# Patient Record
Sex: Female | Born: 1980 | Race: Black or African American | Hispanic: No | Marital: Married | Smoking: Never smoker
Health system: Southern US, Community
[De-identification: ages and names within clinical notes are randomized; demographics above are authoritative.]

## PROBLEM LIST (undated history)

## (undated) DIAGNOSIS — O039 Complete or unspecified spontaneous abortion without complication: Secondary | ICD-10-CM

## (undated) DIAGNOSIS — A549 Gonococcal infection, unspecified: Secondary | ICD-10-CM

## (undated) DIAGNOSIS — J189 Pneumonia, unspecified organism: Secondary | ICD-10-CM

## (undated) DIAGNOSIS — G43909 Migraine, unspecified, not intractable, without status migrainosus: Secondary | ICD-10-CM

## (undated) DIAGNOSIS — A749 Chlamydial infection, unspecified: Secondary | ICD-10-CM

## (undated) HISTORY — DX: Complete or unspecified spontaneous abortion without complication: O03.9

## (undated) HISTORY — DX: Pneumonia, unspecified organism: J18.9

## (undated) HISTORY — DX: Chlamydial infection, unspecified: A74.9

## (undated) HISTORY — DX: Migraine, unspecified, not intractable, without status migrainosus: G43.909

## (undated) HISTORY — DX: Gonococcal infection, unspecified: A54.9

---

## 1999-05-31 ENCOUNTER — Other Ambulatory Visit: Admission: RE | Admit: 1999-05-31 | Discharge: 1999-05-31 | Payer: Self-pay | Admitting: Obstetrics and Gynecology

## 2000-06-01 ENCOUNTER — Other Ambulatory Visit: Admission: RE | Admit: 2000-06-01 | Discharge: 2000-06-01 | Payer: Self-pay | Admitting: Obstetrics and Gynecology

## 2001-05-12 DIAGNOSIS — O039 Complete or unspecified spontaneous abortion without complication: Secondary | ICD-10-CM

## 2001-05-12 HISTORY — DX: Complete or unspecified spontaneous abortion without complication: O03.9

## 2001-06-02 ENCOUNTER — Other Ambulatory Visit: Admission: RE | Admit: 2001-06-02 | Discharge: 2001-06-02 | Payer: Self-pay | Admitting: Obstetrics and Gynecology

## 2002-07-07 ENCOUNTER — Other Ambulatory Visit: Admission: RE | Admit: 2002-07-07 | Discharge: 2002-07-07 | Payer: Self-pay | Admitting: Obstetrics and Gynecology

## 2003-07-13 ENCOUNTER — Other Ambulatory Visit: Admission: RE | Admit: 2003-07-13 | Discharge: 2003-07-13 | Payer: Self-pay | Admitting: Obstetrics and Gynecology

## 2005-11-29 ENCOUNTER — Emergency Department (HOSPITAL_COMMUNITY): Admission: EM | Admit: 2005-11-29 | Discharge: 2005-11-29 | Payer: Self-pay | Admitting: Emergency Medicine

## 2009-01-26 ENCOUNTER — Ambulatory Visit: Payer: Self-pay | Admitting: Women's Health

## 2009-01-26 ENCOUNTER — Encounter: Payer: Self-pay | Admitting: Women's Health

## 2009-01-26 ENCOUNTER — Other Ambulatory Visit: Admission: RE | Admit: 2009-01-26 | Discharge: 2009-01-26 | Payer: Self-pay | Admitting: Gynecology

## 2009-06-29 ENCOUNTER — Ambulatory Visit: Payer: Self-pay | Admitting: Family Medicine

## 2009-06-29 LAB — CONVERTED CEMR LAB
Inflenza A Ag: POSITIVE
Influenza B Ag: POSITIVE
Rapid Strep: NEGATIVE

## 2009-06-30 ENCOUNTER — Encounter: Payer: Self-pay | Admitting: Family Medicine

## 2009-12-26 ENCOUNTER — Ambulatory Visit: Payer: Self-pay | Admitting: Family Medicine

## 2009-12-26 DIAGNOSIS — G43909 Migraine, unspecified, not intractable, without status migrainosus: Secondary | ICD-10-CM | POA: Insufficient documentation

## 2009-12-26 DIAGNOSIS — D509 Iron deficiency anemia, unspecified: Secondary | ICD-10-CM

## 2009-12-27 ENCOUNTER — Telehealth (INDEPENDENT_AMBULATORY_CARE_PROVIDER_SITE_OTHER): Payer: Self-pay | Admitting: *Deleted

## 2009-12-27 ENCOUNTER — Telehealth: Payer: Self-pay | Admitting: Family Medicine

## 2009-12-27 LAB — CONVERTED CEMR LAB
ALT: 10 units/L (ref 0–35)
AST: 17 units/L (ref 0–37)
Albumin: 4.1 g/dL (ref 3.5–5.2)
Alkaline Phosphatase: 48 units/L (ref 39–117)
BUN: 5 mg/dL — ABNORMAL LOW (ref 6–23)
CO2: 26 meq/L (ref 19–32)
Calcium: 9.4 mg/dL (ref 8.4–10.5)
Chloride: 103 meq/L (ref 96–112)
Cholesterol: 196 mg/dL (ref 0–200)
Creatinine, Ser: 0.71 mg/dL (ref 0.40–1.20)
Glucose, Bld: 87 mg/dL (ref 70–99)
HCT: 40.1 % (ref 36.0–46.0)
HDL: 59 mg/dL (ref >39)
Hemoglobin: 12.8 g/dL (ref 12.0–15.0)
LDL Cholesterol: 125 mg/dL — ABNORMAL HIGH (ref 0–99)
MCHC: 31.9 g/dL (ref 30.0–36.0)
MCV: 100 fL (ref 78.0–100.0)
Platelets: 322 10*3/uL (ref 150–400)
Potassium: 4 meq/L (ref 3.5–5.3)
RBC: 4.01 M/uL (ref 3.87–5.11)
RDW: 13.4 % (ref 11.5–15.5)
Sodium: 139 meq/L (ref 135–145)
Total Bilirubin: 0.3 mg/dL (ref 0.3–1.2)
Total CHOL/HDL Ratio: 3.3
Total Protein: 7.2 g/dL (ref 6.0–8.3)
Triglycerides: 58 mg/dL (ref <150)
VLDL: 12 mg/dL (ref 0–40)
WBC: 6.2 10*3/uL (ref 4.0–10.5)

## 2010-02-22 ENCOUNTER — Ambulatory Visit: Payer: Self-pay | Admitting: Family Medicine

## 2010-05-12 DIAGNOSIS — A749 Chlamydial infection, unspecified: Secondary | ICD-10-CM

## 2010-05-12 DIAGNOSIS — A549 Gonococcal infection, unspecified: Secondary | ICD-10-CM

## 2010-05-12 HISTORY — DX: Chlamydial infection, unspecified: A74.9

## 2010-05-12 HISTORY — DX: Gonococcal infection, unspecified: A54.9

## 2010-05-29 ENCOUNTER — Other Ambulatory Visit
Admission: RE | Admit: 2010-05-29 | Discharge: 2010-05-29 | Payer: Self-pay | Source: Home / Self Care | Admitting: Gynecology

## 2010-05-29 ENCOUNTER — Ambulatory Visit
Admission: RE | Admit: 2010-05-29 | Discharge: 2010-05-29 | Payer: Self-pay | Source: Home / Self Care | Attending: Women's Health | Admitting: Women's Health

## 2010-05-29 ENCOUNTER — Other Ambulatory Visit: Payer: Self-pay | Admitting: Women's Health

## 2010-06-13 NOTE — Assessment & Plan Note (Signed)
Summary: TB skin test//mpm  Nurse Visit   Vitals Entered By: Payton Spark CMA (February 22, 2010 9:16 AM)  Allergies: 1)  ! Penicillin  Immunizations Administered:  PPD Skin Test:    Vaccine Type: PPD    Site: right forearm    Dose: 0.1 ml    Route: ID    Given by: Payton Spark CMA  Orders Added: 1)  TB Skin Test [86580] 2)  Admin 1st Vaccine [08657]  Appended Document: TB skin test//mpm   PPD Results    Date of reading: 02/25/2010    Results: < 5mm    Interpretation: negative

## 2010-06-13 NOTE — Letter (Signed)
Summary: Health Exam Form/[employer]  Health Exam Form/[employer]   Imported By: Lanelle Bal 05/22/2010 10:47:40  _____________________________________________________________________  External Attachment:    Type:   Image     Comment:   External Document

## 2010-06-13 NOTE — Progress Notes (Signed)
Summary: results  Phone Note Call from Patient Call back at [phone]   Caller: Patient Call For: Marcelino Duster Summary of Call: Pt called and LM wanting lab results Initial call taken by: Kathlene November,  December 27, 2009 3:51 PM  Follow-up for Phone Call        [facility] for Pt to CB Follow-up by: Payton Spark CMA,  December 28, 2009 10:04 AM

## 2010-06-13 NOTE — Assessment & Plan Note (Signed)
Summary: NOV migraines   Vital Signs:  Patient profile:   30 year old female Menstrual status:  regular LMP:     0/08/2009 Height:      62.5 inches Weight:      168 pounds BMI:     30.35 O2 Sat:      100 % on Room air Pulse rate:   83 / minute BP sitting:   115 / 82  (left arm) Cuff size:   regular  Vitals Entered By: Payton Spark CMA (December 26, 2009 8:49 AM)  O2 Flow:  Room air CC: New to est. C/O daily HAs x months. LMP (date): 0/08/2009     Menstrual Status regular Enter LMP: 0/08/2009   Primary Care Provider:  Seymour Bars DO  CC:  New to est. C/O daily HAs x months..  History of Present Illness: 30 yo AAF presents for NOV.  She sees [facility] for pap smears, last done 12-2008.  She recently stopped Loestrin for birth control, thinking it would help her HAs that she's had everday for the past year but it did not change this.  She had an eye exam and recieved a new pair of glasses last year but this has not helped her HAs either.  Her HA pain is in the forehead and the top of her head with neck tightness.  She is usually waking up with HAs.  She has phonosensitivity, throbbing and nausea.  Having 2-3 incapacitating HAs/ month.  Has tried Tylenol and Sinus meds but nothing is really helping.  She notes a lack of exercise and poor sleep.  Currently out of work.    Current Medications (verified): 1)  None  Allergies (verified): 1)  ! Penicillin  Past History:  Past Medical History: Reviewed history from 06/29/2009 and no changes required. Unremarkable  Past Surgical History: Reviewed history from 06/29/2009 and no changes required. 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, broadcasting/film/video Drug use-no Regular exercise-no  Review of  Systems       no fevers/sweats/weakness, unexplained wt loss/gain, no change in vision, no difficulty hearing, ringing in ears, no hay fever/allergies, no CP/discomfort, no palpitations, no breast lump/nipple discharge, no cough/wheeze, no blood in stool, N/V/D, no nocturia, no leaking urine, no unusual vag bleeding, no vaginal/penile discharge, no muscle/joint pain, no rash, no new/changing mole, + HA, no memory loss, no anxiety, no sleep problem, no depression, no unexplained lumps, no easy bruising/bleeding, no concern with sexual function   Physical Exam  General:  alert, well-developed, well-nourished, well-hydrated, and overweight-appearing.   Head:  normocephalic and atraumatic.   Eyes:  pupils equal, pupils round, and pupils reactive to light.   Mouth:  good dentition and pharynx pink and moist.   Neck:  no masses.   Lungs:  Normal respiratory effort, chest expands symmetrically. Lungs are clear to auscultation, no crackles or wheezes. Heart:  Normal rate and regular rhythm. S1 and S2 normal without gallop, murmur, click, rub or other extra sounds. Msk:  trapezious tightness with full C spine ROM Neurologic:  cranial nerves II-XII intact.   Skin:  color normal.   Psych:  good eye contact, not anxious appearing, and not depressed appearing.     Impression & Recommendations:  Problem # 1:  MIGRAINE, CHRONIC (ICD-346.90) Migraine HA by criteria, in the chronic  daily HA pattern. H/o given on migraine prevention.  Start on Amitriptyline at bedtime for migraine prevention and add Naratriptan for rescue.  Use Excedrin Migraine for mild to moderate HAs but limit to 2 x a wk. BP looks great.  Vision has already been corrected.  Update labs today.  Off OCPs, look for iron defic. Work on exercise, low caffeine intake and improving sleep.  F/U in 2 mos. The following medications were removed from the medication list:    Tylenol Extra Strength 500 Mg Tabs (Acetaminophen) .Marland Kitchen... 2 tab by mouth  once daily    Mobic 7.5 Mg Tabs (Meloxicam) ..... Sig 1 by mouth q day as needed for feveer and aches may take w/ tylenol but do not take w/ motrin or alleve Her updated medication list for this problem includes:    Naratriptan Hcl 2.5 Mg Tabs (Naratriptan hcl) .Marland Kitchen... 1 tab by mouth x1 as needed migraine headache; repeat in 4 hrs if needed  Complete Medication List: 1)  Amitriptyline Hcl 25 Mg Tabs (Amitriptyline hcl) .... 1/2 tab by mouth at bedtime x 1 wk then increase to 1 tab by mouth qhs 2)  Naratriptan Hcl 2.5 Mg Tabs (Naratriptan hcl) .Marland Kitchen.. 1 tab by mouth x1 as needed migraine headache; repeat in 4 hrs if needed  Other Orders: T-CBC No Diff ([id number]) T-Comprehensive Metabolic Panel ([id number]) T-Lipid Profile ([id number])  Patient Instructions: 1)  Read thru handout on migraines/ migraine prevention. 2)  Start on Amitriptyline at bedtime for migarine prevention.  Start with 1/2 tab each night x 1 wk then go up to a full tab. 3)  Use Naratriptan for rescue of severe headaches.  Limit to 2/ wk. 4)  Use Excedrin Migraine for less severe headaches up to 2 x a wk. Prescriptions: NARATRIPTAN HCL 2.5 MG TABS (NARATRIPTAN HCL) 1 tab by mouth x1 as needed migraine headache; repeat in 4 hrs if needed  #9 tabs x 2   Entered and Authorized by:   Seymour Bars DO   Signed by:   Seymour Bars DO on 12/26/2009   Method used:   [facility] to        [facility]  [address] [id number]       [address]       [place], [place]  [address]       Ph: [phone] or [phone]       Fax: [phone]   RxID:   [id number] AMITRIPTYLINE HCL 25 MG TABS (AMITRIPTYLINE HCL) 1/2 tab by mouth at bedtime x 1 wk then increase to 1 tab by mouth qhs  #30 x 2   Entered and Authorized by:   Seymour Bars DO   Signed by:   Seymour Bars DO on 12/26/2009   Method used:   [facility]        [facility]  [address] [id number]       [address]       [place], [place]  [address]       Ph: [phone] or [phone]       Fax: [phone]   RxID:   [id number]

## 2010-06-13 NOTE — Letter (Signed)
Summary: Out of Work  [facility]  [address]   [place], [place] [address]   Phone: [phone]  Fax: [phone]    June 29, 2009   Employee:  Nina Hill    To Whom It May Concern:   For Medical reasons, please excuse the above named employee from work for the following dates:  Start:   06/29/2009  Return:   07/04/2009  If you need additional information, please feel free to contact our office.         Sincerely,    Hassan Rowan MD

## 2010-06-13 NOTE — Progress Notes (Signed)
Summary: Naratriptan denied  Phone Note From Pharmacy   Summary of Call: Pt's insurance denied naratriptan.  Initial call taken by: Payton Spark CMA,  December 27, 2009 12:18 PM  Follow-up for Phone Call        both sumatriptan and naratriptan are generic migraine rescue meds that pull up as 'covered' but no preferred.  Pt can call her insurance co for a list of preffered meds OR stick with Excedrin Migrane OTC for rescue. Follow-up by: Seymour Bars DO,  December 28, 2009 8:01 AM     Appended Document: Naratriptan denied LMOM for Pt to CB.Arvilla Market CMA, Michelle December 28, 2009 10:05 AM   Pt aware of the above.Arvilla Market CMA, Michelle December 28, 2009 10:50 AM

## 2010-06-13 NOTE — Assessment & Plan Note (Signed)
Summary: Fever, cough-dry, sorethroat, bodyaches x 2 dys rm 1   Vital Signs:  Patient Profile:   30 Years Old Female CC:      Cold & URI symptoms Height:     62 inches Weight:      168 pounds O2 Sat:      98 % O2 treatment:    Room Air Temp:     102.6 degrees F oral Pulse rate:   100 / minute Pulse rhythm:   regular Resp:     16 per minute BP sitting:   126 / 83  (right arm) Cuff size:   regular  Vitals Entered By: Areta Haber CMA (June 29, 2009 2:27 PM)                  Prior Medication List:  No prior medications documented  Current Allergies (reviewed today): ! PENICILLIN   History of Present Illness Chief Complaint: Cold & URI symptoms History of Present Illness: Patient has been sick since Wednesday. Fever running nose ,stuffy nose and congestion. Coughing and aching all over.  Current Problems: ACUTE NASOPHARYNGITIS (ICD-460) INFLUENZA (ICD-487.8) FAMILY HISTORY DIABETES 1ST DEGREE RELATIVE (ICD-V18.0)   Current Meds LOESTRIN 24 FE 1-20 MG-MCG TABS (NORETHIN ACE-ETH ESTRAD-FE) 1 tab by mouth once daily TYLENOL EXTRA STRENGTH 500 MG TABS (ACETAMINOPHEN) 2 tab by mouth once daily DAYQUIL MULTI-SYMPTOM 30-325-10 MG/15ML LIQD (PSEUDOEPHEDRINE-APAP-DM) As directed TAMIFLU 75 MG CAPS (OSELTAMIVIR PHOSPHATE) Take one capsule by mouth twice a day TUSSIONEX PENNKINETIC ER 8-10 MG/5ML LQCR (CHLORPHENIRAMINE-HYDROCODONE) sig 1 tsp by mouth twice a day as needed for cough  if afordable or covered by insurence MOBIC 7.5 MG TABS (MELOXICAM) sig 1 by mouth q day as needed for feveer and aches may take w/ tylenol but do not take w/ motrin or alleve  REVIEW OF SYSTEMS Constitutional Symptoms       Complains of fever and chills.     Denies night sweats, weight loss, weight gain, and fatigue.      Comments: bodyaches Eyes       Denies change in vision, eye pain, eye discharge, glasses, contact lenses, and eye surgery. Ear/Nose/Throat/Mouth       Complains of sore  throat.      Denies hearing loss/aids, change in hearing, ear pain, ear discharge, dizziness, frequent runny nose, frequent nose bleeds, sinus problems, hoarseness, and tooth pain or bleeding.      Comments: nasal congestion x 2 dys Respiratory       Complains of dry cough.      Denies productive cough, wheezing, shortness of breath, asthma, bronchitis, and emphysema/COPD.  Cardiovascular       Denies murmurs, chest pain, and tires easily with exhertion.    Gastrointestinal       Denies stomach pain, nausea/vomiting, diarrhea, constipation, blood in bowel movements, and indigestion. Genitourniary       Denies painful urination, kidney stones, and loss of urinary control. Neurological       Complains of headaches.      Denies paralysis, seizures, and fainting/blackouts. Musculoskeletal       Denies muscle pain, joint pain, joint stiffness, decreased range of motion, redness, swelling, muscle weakness, and gout.  Skin       Denies bruising, unusual mles/lumps or sores, and hair/skin or nail changes.  Psych       Denies mood changes, temper/anger issues, anxiety/stress, speech problems, depression, and sleep problems.  Past History:  Family History: Last updated: 06/29/2009 Family History Diabetes 1st  degree relative Family History Hypertension Glaucoma  Social History: Last updated: 06/29/2009 Single Never Smoked Alcohol use-no Drug use-no Regular exercise-no  Risk Factors: Exercise: no (06/29/2009)  Risk Factors: Smoking Status: never (06/29/2009)  Past Medical History: Unremarkable  Past Surgical History: Denies surgical history  Family History: Reviewed history and no changes required. Family History Diabetes 1st degree relative Family History Hypertension Glaucoma  Social History: Reviewed history and no changes required. Single Never Smoked Alcohol use-no Drug use-no Regular exercise-no Smoking Status:  never Drug Use:  no Does Patient Exercise:   no Physical Exam General appearance: well developed, well nourished, ill in apperence Head: normocephalic, atraumatic Eyes: conjunctivae and lids normal Ears: normal, no lesions or deformities Nasal: pale, boggy, swollen nasal turbinates Oral/Pharynx: pharyngeal erythema without exudate, uvula midline without deviation Neck: supple,anterior lymphadenopathy present Chest/Lungs: no rales, wheezes, or rhonchi bilateral, breath sounds equal without effort Skin: no obvious rashes or lesions MSE: oriented to time, place, and person Assessment New Problems: ACUTE NASOPHARYNGITIS (ICD-460) INFLUENZA (ICD-487.8) FAMILY HISTORY DIABETES 1ST DEGREE RELATIVE (ICD-V18.0)  flu  Patient Education: Patient and/or caregiver instructed in the following: rest fluids and Tylenol.  Plan New Medications/Changes: MOBIC 7.5 MG TABS (MELOXICAM) sig 1 by mouth q day as needed for feveer and aches may take w/ tylenol but do not take w/ motrin or alleve  #30 x 0, 06/29/2009, Hassan Rowan MD TUSSIONEX PENNKINETIC ER 8-10 MG/5ML LQCR (CHLORPHENIRAMINE-HYDROCODONE) sig 1 tsp by mouth twice a day as needed for cough  if afordable or covered by insurence  #60floz x 0, 06/29/2009, Hassan Rowan MD TAMIFLU 75 MG CAPS (OSELTAMIVIR PHOSPHATE) Take one capsule by mouth twice a day  #10 x 0, 06/29/2009, Hassan Rowan MD  New Orders: T-Culture, Rapid Strep [[id number]] Flu A+B [87400] Rapid Strep [41324] New Patient Level III [40102] Planning Comments:   as below  Follow Up: Follow up in 2-3 days if no improvement, Follow up with Primary Physician Work/School Excuse: Return to work/school in 3 days  The patient and/or caregiver has been counseled thoroughly with regard to medications prescribed including dosage, schedule, interactions, rationale for use, and possible side effects and they verbalize understanding.  Diagnoses and expected course of recovery discussed and will return if not improved as expected or if the  condition worsens. Patient and/or caregiver verbalized understanding.  Prescriptions: MOBIC 7.5 MG TABS (MELOXICAM) sig 1 by mouth q day as needed for feveer and aches may take w/ tylenol but do not take w/ motrin or alleve  #30 x 0   Entered and Authorized by:   Hassan Rowan MD   Signed by:   Hassan Rowan MD on 06/29/2009   Method used:   Print then Give to Patient   RxID:   [id number] TUSSIONEX PENNKINETIC ER 8-10 MG/5ML LQCR (CHLORPHENIRAMINE-HYDROCODONE) sig 1 tsp by mouth twice a day as needed for cough  if afordable or covered by insurence  #81floz x 0   Entered and Authorized by:   Hassan Rowan MD   Signed by:   Hassan Rowan MD on 06/29/2009   Method used:   Print then Give to Patient   RxID:   [id number] TAMIFLU 75 MG CAPS (OSELTAMIVIR PHOSPHATE) Take one capsule by mouth twice a day  #10 x 0   Entered and Authorized by:   Hassan Rowan MD   Signed by:   Hassan Rowan MD on 06/29/2009   Method used:   Print then Give to Patient   RxID:   [id number]  Patient Instructions: 1)  Get plenty of rest, drink lots of clear liquids, and use Tylenol or Ibuprofen for fever and comfort. Return in 7-10 days if you're not better:sooner if you're feeling worse. 2)  Take 650-1000mg  of Tylenol every 4-6 hours as needed for relief of pain or comfort of fever AVOID taking more than 4000mg   in a 24 hour period (can cause liver damage in higher doses).  Laboratory Results  Date/Time Received: June 29, 2009 2:52 PM  Date/Time Reported: June 29, 2009 2:52 PM   Other Tests  Rapid Strep: negative Influenza A: positive Influenza B: positive  Kit Test Internal QC: Positive   (Normal Range: Negative)

## 2010-06-17 ENCOUNTER — Ambulatory Visit (INDEPENDENT_AMBULATORY_CARE_PROVIDER_SITE_OTHER): Payer: BC Managed Care – PPO | Admitting: Women's Health

## 2010-06-17 ENCOUNTER — Ambulatory Visit: Payer: Self-pay | Admitting: Women's Health

## 2010-06-17 DIAGNOSIS — R350 Frequency of micturition: Secondary | ICD-10-CM

## 2010-06-17 DIAGNOSIS — Z113 Encounter for screening for infections with a predominantly sexual mode of transmission: Secondary | ICD-10-CM

## 2010-08-27 ENCOUNTER — Encounter: Payer: Self-pay | Admitting: Family Medicine

## 2010-08-29 ENCOUNTER — Ambulatory Visit (INDEPENDENT_AMBULATORY_CARE_PROVIDER_SITE_OTHER): Payer: BC Managed Care – PPO | Admitting: Family Medicine

## 2010-08-29 ENCOUNTER — Ambulatory Visit
Admission: RE | Admit: 2010-08-29 | Discharge: 2010-08-29 | Disposition: A | Discharge status: Home / Self Care | Payer: BC Managed Care – PPO | Source: Ambulatory Visit | Attending: Family Medicine | Admitting: Family Medicine

## 2010-08-29 ENCOUNTER — Encounter: Payer: Self-pay | Admitting: Family Medicine

## 2010-08-29 VITALS — BP 147/88 | HR 110 | Ht 62.0 in | Wt 184.0 lb

## 2010-08-29 DIAGNOSIS — S86911A Strain of unspecified muscle(s) and tendon(s) at lower leg level, right leg, initial encounter: Secondary | ICD-10-CM | POA: Insufficient documentation

## 2010-08-29 DIAGNOSIS — IMO0002 Reserved for concepts with insufficient information to code with codable children: Secondary | ICD-10-CM

## 2010-08-29 MED ORDER — IBUPROFEN 800 MG PO TABS: 800.0000 mg | ORAL_TABLET | Freq: Three times a day (TID) | ORAL | Status: AC | PRN

## 2010-08-29 NOTE — Patient Instructions (Signed)
Xray R knee downstairs today. Will call you w/ results tomorrow.  Wear an OTC knee sleeve during the day for support.  Start RX Ibuprofen 800 mg with breakfast, lunch and dinner for the next 2-3 wks.  If pain is not improving in 2-3 wks, please call and I will get you in with ortho.

## 2010-08-29 NOTE — Assessment & Plan Note (Signed)
R knee medial strain vs meniscal tear w/o trauma.  Will xray today to look for degenerative changes.  Treat with OTC elastic knee sleeve, RX Ibuprofen 800 mg tid with food x 2-3 wks and avoid activities which cause pain.  If not improved in 2-3 wks, will get her in with ortho.

## 2010-08-29 NOTE — Progress Notes (Signed)
  Subjective:    Patient ID: Nina Hill, female    DOB: November 17, 1980, 30 y.o.   MRN: [id number]  HPI  30 yo AAF presents for R knee pain for about a month.  No previous problems/ surgery.  She denies any trauma.  She is using aleve, heat and ice  Which is not doing much.  It hurts when she stands and walks.  Usually has a dull ache at night.  No swelling.  She has pain with going up and down stairs.  No radiation of pain.  She is standing and sitting at her job.  Denies any new exercise.  Has gained more weight.  BP 147/88  Pulse 110  Ht 5\' 2"  (1.575 m)  Wt 184 lb (83.462 kg)  BMI 33.65 kg/m2  SpO2 100%  LMP 08/20/2010   Review of Systems  Respiratory: Negative for shortness of breath.   Cardiovascular: Negative for chest pain.  Musculoskeletal: Positive for arthralgias. Negative for back pain, joint swelling and gait problem.       Objective:   Physical Exam  Constitutional: She appears well-developed and well-nourished.  Cardiovascular: Normal rate, regular rhythm and normal heart sounds.   No murmur heard. Pulmonary/Chest: Effort normal and breath sounds normal.  Musculoskeletal:       Right knee: She exhibits normal range of motion, no swelling, no effusion, no ecchymosis, no erythema, normal alignment, no LCL laxity, normal patellar mobility, normal meniscus and no MCL laxity. tenderness found. Medial joint line tenderness noted. No patellar tendon tenderness noted.          Assessment & Plan:

## 2010-08-30 ENCOUNTER — Telehealth: Payer: Self-pay | Admitting: Family Medicine

## 2010-08-30 NOTE — Telephone Encounter (Signed)
Pls let pt know that her knee xray came back normal.  Continue current treatment plan.

## 2010-09-02 NOTE — Telephone Encounter (Signed)
I have attempted to contact this patient by phone with the following results: left message to return my call on answering machine.

## 2010-09-09 ENCOUNTER — Ambulatory Visit (INDEPENDENT_AMBULATORY_CARE_PROVIDER_SITE_OTHER): Payer: BC Managed Care – PPO | Admitting: Women's Health

## 2010-09-09 DIAGNOSIS — B373 Candidiasis of vulva and vagina: Secondary | ICD-10-CM

## 2010-09-09 DIAGNOSIS — Z113 Encounter for screening for infections with a predominantly sexual mode of transmission: Secondary | ICD-10-CM

## 2010-09-09 DIAGNOSIS — R350 Frequency of micturition: Secondary | ICD-10-CM

## 2010-09-09 DIAGNOSIS — N898 Other specified noninflammatory disorders of vagina: Secondary | ICD-10-CM

## 2010-09-11 NOTE — Telephone Encounter (Signed)
I spoke to pt today and she states someone called and left results on her voicemail.  Follow up ended.

## 2010-10-03 ENCOUNTER — Telehealth: Payer: Self-pay | Admitting: Family Medicine

## 2010-10-03 MED ORDER — HYDROCODONE-ACETAMINOPHEN 5-500 MG PO TABS: 1.0000 | ORAL_TABLET | Freq: Three times a day (TID) | ORAL | Status: AC | PRN

## 2010-10-03 NOTE — Telephone Encounter (Signed)
I added a short term RX for Vicodin if you can call this in for her.  It may make her sleepy, so she may not be able to take it and work.  Take with food.  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

## 2010-10-03 NOTE — Telephone Encounter (Signed)
LMOM informing Pt of the above. Rx called in

## 2010-10-03 NOTE — Telephone Encounter (Signed)
Pt called and is still experiencing RT knee pain #8/10.  She is using the Ibuprofen 800 mg PO tid as instructed and is not participating in strenous activities or any activities, and she is using her knee sleeve as instructed.  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  Can she get a diff pain medication that may help more???? Please advise. Plan:  Routed to Dr. Arlice Colt, LPN Domingo Dimes

## 2010-10-08 ENCOUNTER — Telehealth: Payer: Self-pay | Admitting: Family Medicine

## 2010-10-08 ENCOUNTER — Other Ambulatory Visit: Payer: Self-pay | Admitting: Family Medicine

## 2010-10-08 DIAGNOSIS — G479 Sleep disorder, unspecified: Secondary | ICD-10-CM

## 2010-10-08 NOTE — Telephone Encounter (Signed)
Pt called and said her amitriptyline may need prior auth.   Plan:  Told pt to call [facility] pharm [facility] and check to see if there is refills remaining and if not, to have pharm send request for refill auth.  Will allow a 30 day supply X 1, because pt was notified that it has been 9 mths since she started the amitriptyline for her headaches and never followed up for this.  Told needs an OV within the next 30 days for her headaches.  Pt voiced understanding. Jarvis Newcomer, LPN Dom[facility] Dimes

## 2010-10-08 NOTE — Telephone Encounter (Signed)
Needs refill of amitriptyline 25 mg PO QHS. Plan:  #30/0 refills will be sent to pt pharm, and pt will need to follow up in office before the end of 30 days.  Routed to Dr. Cathey Endow to [facility] before sending to the pharmacy. Jarvis Newcomer, LPN [facility] Dimes

## 2010-11-18 ENCOUNTER — Ambulatory Visit (INDEPENDENT_AMBULATORY_CARE_PROVIDER_SITE_OTHER): Payer: BC Managed Care – PPO | Admitting: Family Medicine

## 2010-11-18 ENCOUNTER — Encounter: Payer: Self-pay | Admitting: Family Medicine

## 2010-11-18 DIAGNOSIS — M222X9 Patellofemoral disorders, unspecified knee: Secondary | ICD-10-CM | POA: Insufficient documentation

## 2010-11-18 DIAGNOSIS — G43909 Migraine, unspecified, not intractable, without status migrainosus: Secondary | ICD-10-CM

## 2010-11-18 DIAGNOSIS — M25569 Pain in unspecified knee: Secondary | ICD-10-CM

## 2010-11-18 MED ORDER — DESIPRAMINE HCL 25 MG PO TABS: 25.0000 mg | ORAL_TABLET | Freq: Every day | ORAL | Status: DC

## 2010-11-18 MED ORDER — BUTALBITAL-APAP-CAFFEINE 50-300-40 MG PO CAPS: ORAL_CAPSULE | ORAL | Status: DC

## 2010-11-18 NOTE — Assessment & Plan Note (Signed)
With medial joint line pain on the R x months, improving with time and regular exercise.  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  Can use knee sleeve during the day, ice and save NSAIDs for more severe pain.

## 2010-11-18 NOTE — Assessment & Plan Note (Signed)
Due to weight gain on Amitriptyline, I will change her to Desipramine 25 mg qhs.  Had SEs from Naratriptan, so I changed her to [place], samples given.  Call if any problems.  Limit OTC pain relievers to 3 days/ wk.

## 2010-11-18 NOTE — Patient Instructions (Signed)
Change Amitriptyline to Desipramine at night for migraine prevention.  Use Carlisle Cater for migraine rescue.  Continue regular exercise.  Try to wear good arch support.  Call if any problems.  REturn for f/u Headaches in 3 mos.

## 2010-11-18 NOTE — Progress Notes (Signed)
  Subjective:    Patient ID: Nina Hill, female    DOB: 1981/03/09, 31 y.o.   MRN: [id number]  HPI30 yo AAF presents for f/u visit.  Her R knee pain has improved to a 3/10.  She wore a knee sleeve during work which helped.  She had a normal Xray this Spring.  She has to take OTC Ibuprofen when she walks on the treadmill.  She is trying to walk everyday.  No swelling or giving way.  No hip or ankle pain.  Her migraines have worsened.  She is on Amitripytline at bedtime.  She uses Tylenol or Ibuprofen which only helps a little.  Naratripatan makes her jaw hurt.  She has gained some weight since starting it.  She is taking Tylenol or Aleve about 3 days/ wk.  BP 122/86  Pulse 90  Ht 5\' 2"  (1.575 m)  Wt 184 lb (83.462 kg)  BMI 33.65 kg/m2  SpO2 98%  LMP 11/13/2010  Review of Systems  Constitutional: Positive for unexpected weight change. Negative for fatigue.  Respiratory: Negative for shortness of breath.   Cardiovascular: Negative for leg swelling.  Musculoskeletal: Positive for arthralgias. Negative for joint swelling.  Neurological: Positive for headaches.       Objective:   Physical Exam  Constitutional: She appears well-developed and well-nourished.  HENT:  Mouth/Throat: Oropharynx is clear and moist.  Cardiovascular: Normal rate, regular rhythm and normal heart sounds.   Pulmonary/Chest: Effort normal and breath sounds normal.  Musculoskeletal: Normal range of motion. She exhibits no edema and no tenderness.  Psychiatric: She has a normal mood and affect.          Assessment & Plan:

## 2011-04-07 ENCOUNTER — Other Ambulatory Visit: Payer: Self-pay | Admitting: *Deleted

## 2011-04-07 MED ORDER — DESIPRAMINE HCL 25 MG PO TABS: 25.0000 mg | ORAL_TABLET | Freq: Every day | ORAL | Status: DC

## 2011-04-08 MED ORDER — DESIPRAMINE HCL 25 MG PO TABS: 25.0000 mg | ORAL_TABLET | Freq: Every day | ORAL | Status: DC

## 2011-04-08 NOTE — Telephone Encounter (Signed)
Addended by: Ellsworth Lennox on: 04/08/2011 04:56 PM   Modules accepted: Orders

## 2011-04-09 ENCOUNTER — Other Ambulatory Visit: Payer: Self-pay | Admitting: *Deleted

## 2011-04-09 MED ORDER — DESIPRAMINE HCL 25 MG PO TABS: 25.0000 mg | ORAL_TABLET | Freq: Every day | ORAL | Status: DC

## 2011-06-05 ENCOUNTER — Encounter: Payer: Self-pay | Admitting: Women's Health

## 2011-06-05 ENCOUNTER — Ambulatory Visit (INDEPENDENT_AMBULATORY_CARE_PROVIDER_SITE_OTHER): Payer: BC Managed Care – PPO | Admitting: Women's Health

## 2011-06-05 VITALS — BP 116/84 | Ht 62.0 in | Wt 169.5 lb

## 2011-06-05 DIAGNOSIS — Z01419 Encounter for gynecological examination (general) (routine) without abnormal findings: Secondary | ICD-10-CM

## 2011-06-05 DIAGNOSIS — R350 Frequency of micturition: Secondary | ICD-10-CM

## 2011-06-05 DIAGNOSIS — E079 Disorder of thyroid, unspecified: Secondary | ICD-10-CM

## 2011-06-05 DIAGNOSIS — N898 Other specified noninflammatory disorders of vagina: Secondary | ICD-10-CM

## 2011-06-05 LAB — URINALYSIS W MICROSCOPIC + REFLEX CULTURE
Bilirubin Urine: NEGATIVE
Glucose, UA: NEGATIVE mg/dL
Leukocytes, UA: NEGATIVE
Specific Gravity, Urine: 1.02 (ref 1.005–1.030)
pH: 6 (ref 5.0–8.0)

## 2011-06-05 LAB — CBC WITH DIFFERENTIAL/PLATELET
Eosinophils Relative: 3 % (ref 0–5)
HCT: 37.5 % (ref 36.0–46.0)
Lymphocytes Relative: 56 % — ABNORMAL HIGH (ref 12–46)
Lymphs Abs: 4 10*3/uL (ref 0.7–4.0)
MCV: 98.9 fL (ref 78.0–100.0)
Monocytes Absolute: 0.3 10*3/uL (ref 0.1–1.0)
Neutro Abs: 2.6 10*3/uL (ref 1.7–7.7)
RBC: 3.79 MIL/uL — ABNORMAL LOW (ref 3.87–5.11)
WBC: 7.2 10*3/uL (ref 4.0–10.5)

## 2011-06-05 LAB — WET PREP FOR TRICH, YEAST, CLUE: Trich, Wet Prep: NONE SEEN

## 2011-06-05 MED ORDER — TERCONAZOLE 0.4 % VA CREA: 1.0000 | TOPICAL_CREAM | Freq: Every day | VAGINAL | Status: AC

## 2011-06-05 NOTE — Progress Notes (Signed)
Nina Hill 1981-04-23 [id number]    History:    The patient presents for annual exam. Monthly 5 day cycle, not sexually active. History of normal paps. History of headaches, currently on Topamax for 1 month with less headaches.   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   ROS:  A  ROS was performed and pertinent positives and negatives are included in the history.  Exam:  Filed Vitals:   06/05/11 1610  BP: 116/84    General appearance:  Normal Head/Neck:  Normal, without cervical or supraclavicular adenopathy. Thyroid:  Symmetrical, normal in size, without palpable masses or nodularity. Respiratory  Effort:  Normal  Auscultation:  Clear without wheezing or rhonchi Cardiovascular  Auscultation:  Regular rate, without rubs, murmurs or gallops  Edema/varicosities:  Not grossly evident Abdominal  Soft,nontender, without masses, guarding or rebound.  Liver/spleen:  No organomegaly noted  Hernia:  None appreciated  Skin  Inspection:  Grossly normal  Palpation:  Grossly normal Neurologic/psychiatric  Orientation:  Normal with appropriate conversation.  Mood/affect:  Normal  Genitourinary    Breasts: Examined lying and sitting.     Right: Without masses, retractions, discharge or axillary adenopathy.     Left: Without masses, retractions, discharge or axillary adenopathy.   Inguinal/mons:  Normal without inguinal adenopathy  External genitalia:  Normal  BUS/Urethra/Skene's glands:  Normal  Bladder:  Normal  Vagina:  Normal  Cervix:  Normal  Uterus:   normal in size, shape and contour.  Midline and mobile  Adnexa/parametria:     Rt: Without masses or tenderness.   Lt: Without masses or tenderness.  Anus and perineum: Normal  Digital rectal exam: Normal sphincter tone without palpated masses or tenderness  Assessment/Plan:  31 y.o. SBF G3P0 for annual exam  complained of occassional itching.     Normal Gyn Exam Headaches with good response to Topamax   Plan:  Continue care with primary care for headaches. SBE's, exercise, MVI daily encouraged.  Contraception reviewed and declined.  States will call for OC's if becomes sexually active and will use condoms.  CBC, TSH, UA . Wet prep negative, Will use Terazol externally prn for vaginal itching.  Instructed to call if no relief.   Harrington Challenger [facility], 4:43 PM 06/05/2011

## 2011-07-09 ENCOUNTER — Telehealth: Payer: Self-pay | Admitting: *Deleted

## 2011-07-09 NOTE — Telephone Encounter (Signed)
Pt called requesting labs results in jan for TSH and hemoglobin. Left message on pt vm with  Normal results.

## 2012-03-01 ENCOUNTER — Other Ambulatory Visit: Payer: Self-pay

## 2012-03-01 ENCOUNTER — Ambulatory Visit
Admission: RE | Admit: 2012-03-01 | Discharge: 2012-03-01 | Disposition: A | Discharge status: Home / Self Care | Payer: BC Managed Care – PPO | Source: Ambulatory Visit

## 2012-03-01 DIAGNOSIS — R509 Fever, unspecified: Secondary | ICD-10-CM

## 2012-04-02 ENCOUNTER — Ambulatory Visit: Payer: BC Managed Care – PPO | Admitting: Women's Health

## 2012-04-11 DIAGNOSIS — J189 Pneumonia, unspecified organism: Secondary | ICD-10-CM

## 2012-04-11 HISTORY — DX: Pneumonia, unspecified organism: J18.9

## 2012-04-12 ENCOUNTER — Other Ambulatory Visit: Payer: Self-pay

## 2012-04-12 ENCOUNTER — Ambulatory Visit
Admission: RE | Admit: 2012-04-12 | Discharge: 2012-04-12 | Disposition: A | Discharge status: Home / Self Care | Payer: BC Managed Care – PPO | Source: Ambulatory Visit

## 2012-04-12 DIAGNOSIS — R509 Fever, unspecified: Secondary | ICD-10-CM

## 2012-06-10 ENCOUNTER — Ambulatory Visit (INDEPENDENT_AMBULATORY_CARE_PROVIDER_SITE_OTHER): Payer: BC Managed Care – PPO | Admitting: Women's Health

## 2012-06-10 ENCOUNTER — Encounter: Payer: Self-pay | Admitting: Women's Health

## 2012-06-10 VITALS — BP 106/64 | Ht 62.5 in | Wt 151.0 lb

## 2012-06-10 DIAGNOSIS — B373 Candidiasis of vulva and vagina: Secondary | ICD-10-CM

## 2012-06-10 DIAGNOSIS — N946 Dysmenorrhea, unspecified: Secondary | ICD-10-CM

## 2012-06-10 DIAGNOSIS — Z01419 Encounter for gynecological examination (general) (routine) without abnormal findings: Secondary | ICD-10-CM

## 2012-06-10 LAB — WET PREP FOR TRICH, YEAST, CLUE
Clue Cells Wet Prep HPF POC: NONE SEEN
Trich, Wet Prep: NONE SEEN

## 2012-06-10 MED ORDER — FLUCONAZOLE 150 MG PO TABS: 150.0000 mg | ORAL_TABLET | Freq: Once | ORAL | Status: DC

## 2012-06-10 MED ORDER — IBUPROFEN 600 MG PO TABS: 600.0000 mg | ORAL_TABLET | Freq: Three times a day (TID) | ORAL | Status: DC | PRN

## 2012-06-10 NOTE — Patient Instructions (Addendum)

## 2012-06-10 NOTE — Progress Notes (Signed)
Nina Hill [birth year] [id number]    History:    The patient presents for annual exam.  Regular monthly cycle, not sexually active. History of normal Paps. Did not receive gardasil. Has lost 18 pounds in the past year with diet and exercise.   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 Has a dog Sam. History of gonorrhea and Chlamydia 2012.   ROS:  A  ROS was performed and pertinent positives and negatives are included in the history.  Exam:  Filed Vitals:   06/10/12 0943  BP: 106/64    General appearance:  Normal Head/Neck:  Normal, without cervical or supraclavicular adenopathy. Thyroid:  Symmetrical, normal in size, without palpable masses or nodularity. Respiratory  Effort:  Normal  Auscultation:  Clear without wheezing or rhonchi Cardiovascular  Auscultation:  Regular rate, without rubs, murmurs or gallops  Edema/varicosities:  Not grossly evident Abdominal  Soft,nontender, without masses, guarding or rebound.  Liver/spleen:  No organomegaly noted  Hernia:  None appreciated  Skin  Inspection:  Grossly normal  Palpation:  Grossly normal Neurologic/psychiatric  Orientation:  Normal with appropriate conversation.  Mood/affect:  Normal  Genitourinary    Breasts: Examined lying and sitting.     Right: Without masses, retractions, discharge or axillary adenopathy.     Left: Without masses, retractions, discharge or axillary adenopathy.   Inguinal/mons:  Normal without inguinal adenopathy  External genitalia:  Normal  BUS/Urethra/Skene's glands:  Normal  Bladder:  Normal  Vagina:  Wet prep positive for few yeast, minimal white discharge   Cervix:  Normal  Uterus:   normal in size, shape and contour.  Midline and mobile  Adnexa/parametria:     Rt: Without masses or tenderness.   Lt: Without masses or tenderness.  Anus and perineum: Normal  Digital rectal  exam: Normal sphincter tone without palpated masses or tenderness  Assessment/Plan:  32 y.o. SBF G3 P0  for annual exam with complaint of vaginal itching.  Yeast Normal GYN exam  Plan: Diflucan 150 by mouth x1 dose, prescription, proper use given and reviewed. Instructed to call if no relief of symptoms. SBE's, exercise, calcium rich diet, MVI daily encouraged. CBC, UA, Pap normal 2012, new screening guidelines reviewed. Condoms encouraged if become sexually active. Yeast prevention discussed.    Harrington Challenger [facility], 10:17 AM 06/10/2012

## 2012-06-11 ENCOUNTER — Encounter: Payer: BC Managed Care – PPO | Admitting: Women's Health

## 2013-06-17 ENCOUNTER — Encounter: Payer: Self-pay | Admitting: Women's Health

## 2013-06-17 ENCOUNTER — Other Ambulatory Visit (HOSPITAL_COMMUNITY)
Admission: RE | Admit: 2013-06-17 | Discharge: 2013-06-17 | Disposition: A | Discharge status: Home / Self Care | Payer: BC Managed Care – PPO | Source: Ambulatory Visit | Attending: Gynecology | Admitting: Gynecology

## 2013-06-17 ENCOUNTER — Ambulatory Visit (INDEPENDENT_AMBULATORY_CARE_PROVIDER_SITE_OTHER): Payer: BC Managed Care – PPO | Admitting: Women's Health

## 2013-06-17 VITALS — BP 114/70 | Ht 62.0 in | Wt 148.8 lb

## 2013-06-17 DIAGNOSIS — Z01419 Encounter for gynecological examination (general) (routine) without abnormal findings: Secondary | ICD-10-CM

## 2013-06-17 DIAGNOSIS — B373 Candidiasis of vulva and vagina: Secondary | ICD-10-CM

## 2013-06-17 DIAGNOSIS — N946 Dysmenorrhea, unspecified: Secondary | ICD-10-CM

## 2013-06-17 DIAGNOSIS — R5383 Other fatigue: Secondary | ICD-10-CM

## 2013-06-17 DIAGNOSIS — B3731 Acute candidiasis of vulva and vagina: Secondary | ICD-10-CM

## 2013-06-17 DIAGNOSIS — R5381 Other malaise: Secondary | ICD-10-CM

## 2013-06-17 LAB — CBC WITH DIFFERENTIAL/PLATELET
BASOS PCT: 1 % (ref 0–1)
Basophils Absolute: 0.1 10*3/uL (ref 0.0–0.1)
EOS ABS: 0.2 10*3/uL (ref 0.0–0.7)
Eosinophils Relative: 3 % (ref 0–5)
HEMATOCRIT: 36 % (ref 36.0–46.0)
HEMOGLOBIN: 11.8 g/dL — AB (ref 12.0–15.0)
LYMPHS ABS: 3.6 10*3/uL (ref 0.7–4.0)
Lymphocytes Relative: 53 % — ABNORMAL HIGH (ref 12–46)
MCH: 31.7 pg (ref 26.0–34.0)
MCHC: 32.8 g/dL (ref 30.0–36.0)
MCV: 96.8 fL (ref 78.0–100.0)
MONO ABS: 0.4 10*3/uL (ref 0.1–1.0)
MONOS PCT: 5 % (ref 3–12)
Neutro Abs: 2.6 10*3/uL (ref 1.7–7.7)
Neutrophils Relative %: 38 % — ABNORMAL LOW (ref 43–77)
Platelets: 377 10*3/uL (ref 150–400)
RBC: 3.72 MIL/uL — ABNORMAL LOW (ref 3.87–5.11)
RDW: 13.1 % (ref 11.5–15.5)
WBC: 6.9 10*3/uL (ref 4.0–10.5)

## 2013-06-17 MED ORDER — IBUPROFEN 600 MG PO TABS: 600.0000 mg | ORAL_TABLET | Freq: Three times a day (TID) | ORAL | Status: DC | PRN

## 2013-06-17 NOTE — Addendum Note (Signed)
Addended by: Alen Blew on: 06/17/2013 05:01 PM   Modules accepted: Orders

## 2013-06-17 NOTE — Patient Instructions (Signed)

## 2013-06-17 NOTE — Progress Notes (Signed)
Nina Hill [birth year] [id number]    History:    Presents for annual exam.  Monthly cycle, heavy flow with cramps. Not sexually active for years. Normal Pap history. Did not receive gardasil.  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 Sam, dog  had pancreatitis and is hypothyroid.  ROS:  A  ROS was performed and pertinent positives and negatives are included.  Exam:  Filed Vitals:   06/17/13 1627  BP: 114/70    General appearance:  Normal Thyroid:  Symmetrical, normal in size, without palpable masses or nodularity. Respiratory  Auscultation:  Clear without wheezing or rhonchi Cardiovascular  Auscultation:  Regular rate, without rubs, murmurs or gallops  Edema/varicosities:  Not grossly evident Abdominal  Soft,nontender, without masses, guarding or rebound.  Liver/spleen:  No organomegaly noted  Hernia:  None appreciated  Skin  Inspection:  Grossly normal   Breasts: Examined lying and sitting.     Right: Without masses, retractions, discharge or axillary adenopathy.     Left: Without masses, retractions, discharge or axillary adenopathy. Gentitourinary   Inguinal/mons:  Normal without inguinal adenopathy  External genitalia:  Normal  BUS/Urethra/Skene's glands:  Normal  Vagina:  Normal  Cervix:  Normal  Uterus:   normal in size, shape and contour.  Midline and mobile  Adnexa/parametria:     Rt: Without masses or tenderness.   Lt: Without masses or tenderness.  Anus and perineum: Normal  Digital rectal exam: Normal sphincter tone without palpated masses or tenderness  Assessment/Plan:  33 y.o. SBF G1P0 for annual exam with complaint of fatigue and menstrual cramps.  Migraines - neurologist manages meds Dysmenorrhea with menorrhagia  Plan: Options reviewed, declines oral contraceptives will use Motrin 600 with cycles.  SBE's, regular exercise, calcium rich diet, MVI daily encouraged. CBC, TSH, UA, Pap. Pap normal 05/2010, new screening guidelines reviewed.    [place], 4:52 PM 06/17/2013

## 2013-06-18 LAB — URINALYSIS W MICROSCOPIC + REFLEX CULTURE
BILIRUBIN URINE: NEGATIVE
Bacteria, UA: NONE SEEN
CASTS: NONE SEEN
Crystals: NONE SEEN
GLUCOSE, UA: NEGATIVE mg/dL
HGB URINE DIPSTICK: NEGATIVE
Ketones, ur: NEGATIVE mg/dL
Leukocytes, UA: NEGATIVE
Nitrite: NEGATIVE
PH: 7.5 (ref 5.0–8.0)
PROTEIN: NEGATIVE mg/dL
SQUAMOUS EPITHELIAL / LPF: NONE SEEN
Specific Gravity, Urine: 1.02 (ref 1.005–1.030)
Urobilinogen, UA: 0.2 mg/dL (ref 0.0–1.0)

## 2013-06-18 LAB — TSH: TSH: 2.586 u[IU]/mL (ref 0.350–4.500)

## 2013-09-21 ENCOUNTER — Encounter: Payer: Self-pay | Admitting: Women's Health

## 2013-09-21 ENCOUNTER — Ambulatory Visit (INDEPENDENT_AMBULATORY_CARE_PROVIDER_SITE_OTHER): Payer: BC Managed Care – PPO | Admitting: Women's Health

## 2013-09-21 VITALS — Ht 62.5 in | Wt 146.8 lb

## 2013-09-21 DIAGNOSIS — Z309 Encounter for contraceptive management, unspecified: Secondary | ICD-10-CM

## 2013-09-21 NOTE — Progress Notes (Signed)
Patient ID: Nina Hill, female   DOB: 06/05/1980, 33 y.o.   MRN: [id number] Presents to discuss contraception. Starting new relationship. Has not been sexually active in greater than 2 years. On Topamax, headache free, has a fear of headaches with birth control pills or weight gain. Has used pills in the past with occasional headaches, had trouble remembering. History of migraines without aura. Denies need for STD screen.  Contraception management Headaches on Topamax with good relief  Plan: Options  reviewed. Mirena IUD information given and reviewed slight risk for infection, perforation hemorrhage. Reviewed Dr. Phineas Real would place with menstrual cycle. Condoms encouraged.

## 2013-10-10 ENCOUNTER — Telehealth: Payer: Self-pay | Admitting: Gynecology

## 2013-10-10 ENCOUNTER — Other Ambulatory Visit: Payer: Self-pay | Admitting: Gynecology

## 2013-10-10 DIAGNOSIS — Z3049 Encounter for surveillance of other contraceptives: Secondary | ICD-10-CM

## 2013-10-10 MED ORDER — LEVONORGESTREL 20 MCG/24HR IU IUD: INTRAUTERINE_SYSTEM | Freq: Once | INTRAUTERINE | Status: DC

## 2013-10-10 NOTE — Telephone Encounter (Signed)
10/10/13-LM VM on cell for patient telling her that the Mirena & insertion would be covered at 100%, no copay under her [facility] ins as it is for contraception. Advised to call when cycle starts to make appt with TF for insertion/[facility] [phone]

## 2013-12-06 ENCOUNTER — Encounter: Payer: Self-pay | Admitting: Gynecology

## 2013-12-06 ENCOUNTER — Ambulatory Visit (INDEPENDENT_AMBULATORY_CARE_PROVIDER_SITE_OTHER): Payer: BC Managed Care – PPO | Admitting: Gynecology

## 2013-12-06 DIAGNOSIS — Z30430 Encounter for insertion of intrauterine contraceptive device: Secondary | ICD-10-CM

## 2013-12-06 DIAGNOSIS — Z113 Encounter for screening for infections with a predominantly sexual mode of transmission: Secondary | ICD-10-CM

## 2013-12-06 NOTE — Patient Instructions (Signed)
Intrauterine Device Insertion Most often, an intrauterine device (IUD) is inserted into the uterus to prevent pregnancy. There are 2 types of IUDs available:  Copper IUD--This type of IUD creates an environment that is not favorable to sperm survival. The mechanism of action of the copper IUD is not known for certain. It can stay in place for 10 years.  Hormone IUD--This type of IUD contains the hormone progestin (synthetic progesterone). The progestin thickens the cervical mucus and prevents sperm from entering the uterus, and it also thins the uterine lining. There is no evidence that the hormone IUD prevents implantation. One hormone IUD can stay in place for up to 5 years, and a different hormone IUD can stay in place for up to 3 years. An IUD is the most cost-effective birth control if left in place for the full duration. It may be removed at any time. LET YOUR HEALTH CARE PROVIDER KNOW ABOUT:  Any allergies you have.  All medicines you are taking, including vitamins, herbs, eye drops, creams, and over-the-counter medicines.  Previous problems you or members of your family have had with the use of anesthetics.  Any blood disorders you have.  Previous surgeries you have had.  Possibility of pregnancy.  Medical conditions you have. RISKS AND COMPLICATIONS  Generally, intrauterine device insertion is a safe procedure. However, as with any procedure, complications can occur. Possible complications include:  Accidental puncture (perforation) of the uterus.  Accidental placement of the IUD either in the muscle layer of the uterus (myometrium) or outside the uterus. If this happens, the IUD can be found essentially floating around the bowels and must be taken out surgically.  The IUD may fall out of the uterus (expulsion). This is more common in women who have recently had a child.   Pregnancy in the fallopian tube (ectopic).  Pelvic inflammatory disease (PID), which is infection of  the uterus and fallopian tubes. The risk of PID is slightly increased in the first 20 days after the IUD is placed, but the overall risk is still very low. BEFORE THE PROCEDURE  Schedule the IUD insertion for when you will have your menstrual period or right after, to make sure you are not pregnant. Placement of the IUD is better tolerated shortly after a menstrual cycle.  You may need to take tests or be examined to make sure you are not pregnant.  You may be required to take a pregnancy test.  You may be required to get checked for sexually transmitted infections (STIs) prior to placement. Placing an IUD in someone who has an infection can make the infection worse.  You may be given a pain reliever to take 1 or 2 hours before the procedure.  An exam will be performed to determine the size and position of your uterus.  Ask your health care provider about changing or stopping your regular medicines. PROCEDURE   A tool (speculum) is placed in the vagina. This allows your health care provider to see the lower part of the uterus (cervix).  The cervix is prepped with a medicine that lowers the risk of infection.  You may be given a medicine to numb each side of the cervix (intracervical or paracervical block). This is used to block and control any discomfort with insertion.  A tool (uterine sound) is inserted into the uterus to determine the length of the uterine cavity and the direction the uterus may be tilted.  A slim instrument (IUD inserter) is inserted through the cervical   canal and into your uterus.  The IUD is placed in the uterine cavity and the insertion device is removed.  The nylon string that is attached to the IUD and used for eventual IUD removal is trimmed. It is trimmed so that it lays high in the vagina, just outside the cervix. AFTER THE PROCEDURE  You may have bleeding after the procedure. This is normal. It varies from light spotting for a few days to menstrual-like  bleeding.  You may have mild cramping. Document Released: 12/25/2010 Document Revised: 02/16/2013 Document Reviewed: 10/17/2012 ExitCare Patient Information 2015 [employer]. This information is not intended to replace advice given to you by your health care provider. Make sure you discuss any questions you have with your health care provider.  

## 2013-12-06 NOTE — Progress Notes (Signed)
Patient presents for Mirena IUD placement. Previously counseled by Izora Gala. She has read through the booklet, has no contraindications and signed the consent form. She currently is on a normal menses.  I reviewed the insertional process with her as well as the risks to include infection, either immediate or long-term, uterine perforation or migration requiring surgery to remove, other complications such as pain, hormonal side effects, infertility and possibility of failure with subsequent pregnancy. She does have a history of chlamydia and gonorrhea 2012 with no recurrences. I reviewed with her that history could put her at slightly higher risk of PID associated with the IUD and I am going to screen her today for GC and chlamydia.   Exam with  Kim  assistant Pelvic: External BUS vagina normal. Cervix normal with normal menses flow. Uterus  anteverted normal size shape contour midline mobile nontender. Adnexa without masses or tenderness.  Procedure: The cervix was  visualized and cleaned with a swab, the GC chlamydia screen was done and then the cervix was cleansed with Betadine, anterior lip grasped with a single-tooth tenaculum, the uterus was sounded and a Mirena IUD was placed according to manufacturer's recommendations without difficulty. The strings were trimmed. The patient tolerated well and will follow up in one month for a postinsertional check.  Lot number:  [id number]  Note: This document was prepared with digital dictation and possible smart phrase technology. Any transcriptional errors that result from this process are unintentional.  Anastasio Auerbach MD, 10:15 AM 12/06/2013

## 2013-12-07 ENCOUNTER — Encounter: Payer: Self-pay | Admitting: Gynecology

## 2013-12-07 LAB — GC/CHLAMYDIA PROBE AMP
CT Probe RNA: NEGATIVE
GC Probe RNA: NEGATIVE

## 2014-01-03 ENCOUNTER — Encounter: Payer: Self-pay | Admitting: Gynecology

## 2014-01-03 ENCOUNTER — Ambulatory Visit (INDEPENDENT_AMBULATORY_CARE_PROVIDER_SITE_OTHER): Payer: BC Managed Care – PPO | Admitting: Gynecology

## 2014-01-03 DIAGNOSIS — Z30431 Encounter for routine checking of intrauterine contraceptive device: Secondary | ICD-10-CM

## 2014-01-03 NOTE — Patient Instructions (Signed)
Followup in February 2016 for annual exam when due. Followup sooner if any issues.

## 2014-01-03 NOTE — Progress Notes (Signed)
Nina Hill 16-Apr-1981 [id number]        33 y.o.  F1Q1975 presents for IUD followup exam. Had Mirena IUD placed 12/06/2013. Has done well but just started her period of the last day or 2.  Past medical history,surgical history, problem list, medications, allergies, family history and social history were all reviewed and documented in the EPIC chart.  Directed ROS with pertinent positives and negatives documented in the history of present illness/assessment and plan.  Exam: Kim assistant General appearance:  Normal External BUS vagina normal with slight menses flow. Cervix normal with IUD string visualized. Uterus grossly normal size midline mobile nontender. Adnexa without masses or tenderness.  Assessment/Plan:  33 y.o. G4P0040 with normal IUD check exam. Patient will keep menstrual calendar as long as does well then will followup in February 2016 when she is due for her annual exam, sooner if any issues.   Note: This document was prepared with digital dictation and possible smart phrase technology. Any transcriptional errors that result from this process are unintentional.   Nina Auerbach MD, 4:48 PM 01/03/2014

## 2014-03-13 ENCOUNTER — Encounter: Payer: Self-pay | Admitting: Gynecology

## 2014-06-19 ENCOUNTER — Encounter: Payer: Self-pay | Admitting: Gynecology

## 2014-06-19 ENCOUNTER — Ambulatory Visit (INDEPENDENT_AMBULATORY_CARE_PROVIDER_SITE_OTHER): Payer: BC Managed Care – PPO | Admitting: Gynecology

## 2014-06-19 VITALS — BP 116/70

## 2014-06-19 DIAGNOSIS — R102 Pelvic and perineal pain: Secondary | ICD-10-CM

## 2014-06-19 DIAGNOSIS — Z113 Encounter for screening for infections with a predominantly sexual mode of transmission: Secondary | ICD-10-CM

## 2014-06-19 DIAGNOSIS — N926 Irregular menstruation, unspecified: Secondary | ICD-10-CM

## 2014-06-19 LAB — URINALYSIS W MICROSCOPIC + REFLEX CULTURE
BILIRUBIN URINE: NEGATIVE
Glucose, UA: NEGATIVE mg/dL
Hgb urine dipstick: NEGATIVE
Ketones, ur: NEGATIVE mg/dL
Leukocytes, UA: NEGATIVE
Nitrite: NEGATIVE
Protein, ur: NEGATIVE mg/dL
SPECIFIC GRAVITY, URINE: 1.015 (ref 1.005–1.030)
Urobilinogen, UA: 0.2 mg/dL (ref 0.0–1.0)
pH: 7 (ref 5.0–8.0)

## 2014-06-19 LAB — WET PREP FOR TRICH, YEAST, CLUE
TRICH WET PREP: NONE SEEN
YEAST WET PREP: NONE SEEN

## 2014-06-19 LAB — PREGNANCY, URINE: Preg Test, Ur: NEGATIVE

## 2014-06-19 MED ORDER — METRONIDAZOLE 500 MG PO TABS: 500.0000 mg | ORAL_TABLET | Freq: Two times a day (BID) | ORAL | Status: DC

## 2014-06-19 NOTE — Progress Notes (Signed)
Nina Hill Feb 01, 1981 [id number]        33 y.o.  [id number] presents with several week history of slowly progressively worsening pelvic pain. Also notes some irregular menses with light flow twice monthly. Had Mirena IUD placed July 2015 and had fairly heavy flows twice monthly which now have slowly decreased to light flows but still occurring twice monthly. Over the last several weeks she started having some pelvic pain bilaterally which occurred sporadically but now is happening on a daily basis. Describes cramping to sharp stabbing. No nausea vomiting diarrhea constipation. No fever or chills. No frequency dysuria urgency. Slight vaginal discharge. No odor or itching. Past history of GC chlamydia 2012.  Screening 11/2013 negative.  Past medical history,surgical history, problem list, medications, allergies, family history and social history were all reviewed and documented in the EPIC chart.  Directed ROS with pertinent positives and negatives documented in the history of present illness/assessment and plan.  Exam: Kim assistant General appearance:  Normal Spine straight without CVA tenderness Abdomen soft nontender without masses guarding rebound Pelvic external BUS vagina with slight bloody discharge. Cervix normal without IUD string visualized. Uterus normal size midline mobile nontender. Adnexa without masses or tenderness.  Colposcopy performed noting cervix slightly stenotic without IUD string visualized in the superficial external canal.  Assessment/Plan:  34 y.o. [id number] with history as above. Wet prep is consistent with bacterial vaginosis. Urinalysis is negative. UPT negative. GC/chlamydia screening done. Will cover with Flagyl 500 mg twice a day 7 days, alcohol avoidance reviewed. Schedule baseline ultrasound for IUD location and rule out ovarian process is source of her pain. Options to include removing her IUD if pain persists discussed. Patient will follow up for ultrasound and  ultimate decision.     Anastasio Auerbach MD, 9:41 AM 06/19/2014

## 2014-06-19 NOTE — Patient Instructions (Signed)
Take the metronidazole antibiotic twice daily for 7 days. Avoid alcohol while taking.  Follow up for the ultrasound as scheduled.

## 2014-06-20 LAB — GC/CHLAMYDIA PROBE AMP
CT Probe RNA: NEGATIVE
GC Probe RNA: NEGATIVE

## 2014-06-30 ENCOUNTER — Ambulatory Visit (INDEPENDENT_AMBULATORY_CARE_PROVIDER_SITE_OTHER): Payer: BC Managed Care – PPO

## 2014-06-30 ENCOUNTER — Encounter: Payer: Self-pay | Admitting: Gynecology

## 2014-06-30 ENCOUNTER — Other Ambulatory Visit: Payer: Self-pay | Admitting: Gynecology

## 2014-06-30 ENCOUNTER — Ambulatory Visit (INDEPENDENT_AMBULATORY_CARE_PROVIDER_SITE_OTHER): Payer: BC Managed Care – PPO | Admitting: Gynecology

## 2014-06-30 DIAGNOSIS — R102 Pelvic and perineal pain unspecified side: Secondary | ICD-10-CM

## 2014-06-30 DIAGNOSIS — N941 Unspecified dyspareunia: Secondary | ICD-10-CM

## 2014-06-30 DIAGNOSIS — N882 Stricture and stenosis of cervix uteri: Secondary | ICD-10-CM

## 2014-06-30 DIAGNOSIS — N938 Other specified abnormal uterine and vaginal bleeding: Secondary | ICD-10-CM

## 2014-06-30 DIAGNOSIS — Z30431 Encounter for routine checking of intrauterine contraceptive device: Secondary | ICD-10-CM

## 2014-06-30 DIAGNOSIS — T8389XA Other specified complication of genitourinary prosthetic devices, implants and grafts, initial encounter: Secondary | ICD-10-CM

## 2014-06-30 HISTORY — PX: IUD REMOVAL: SHX5392

## 2014-06-30 MED ORDER — NORETHINDRONE ACET-ETHINYL EST 1-20 MG-MCG PO TABS
1.0000 | ORAL_TABLET | Freq: Every day | ORAL | Status: DC
Start: 2014-06-30 — End: 2015-01-09

## 2014-06-30 NOTE — Progress Notes (Addendum)
Nina Hill 03-Nov-1980 [id number]        34 y.o.  G4P0040 Patient goals up for ultrasound.  Is having a lot of cramping and pelvic discomfort as well as irregular bleeding. Has Mirena IUD but was unable to see the string had a recent visit.  Past medical history,surgical history, problem list, medications, allergies, family history and social history were all reviewed and documented in the EPIC chart.  Directed ROS with pertinent positives and negatives documented in the history of present illness/assessment and plan.  Exam: Kim assistant General appearance:  Normal External BUS vagina normal. Cervix grossly normal with stenotic appearing os. Uterus normal size midline mobile nontender. Adnexa without masses or tenderness.  Ultrasound shows uterus normal size and echotexture. Small myoma posterior noted at 17 mm. Endometrial echo 3 mm. IUD is slightly rotated and in the lower uterine segment approximately 2.2 cm from endometrial fundus. Ovaries appear normal. Cul-de-sac negative.  Procedure: Cervix initially would not admit the [facility] forceps due to cervical stenosis. The cervix and upper vagina was cleansed with Betadine and a paracervical block using 1% lidocaine was placed a total of 10 cc. The anterior lip of the cervix was grasped with a single-tooth tenaculum and the cervix was gently dilated with a disposable dilator and the IUD string was subsequently grasped with the [facility] forcep and the Mirena IUD removed, sent to the patient and discarded. Patient tolerated the procedure well.  Assessment/Plan:  34 y.o. G4P0040 with cramping and bleeding with IUD that appears to be malpositioned. Options to include keeping the IUD for now versus removing it was discussed. Patient strongly desired the IUD removed regardless and I went ahead and removed it as above. Patient wants to start on oral contraceptives for contraception. Loestrin 1/20 equivalants prescribed. Patient will start this Sunday  use backup contraception assuming she does well will continue through her annual exam due this coming summer. If any issue she'll call me.     Anastasio Auerbach MD, 4:06 PM 06/30/2014

## 2014-06-30 NOTE — Patient Instructions (Signed)
Start on the birth control pills this Sunday. Call me if you have any issues with this. Otherwise follow up in July when you're due for your annual exam.

## 2014-07-05 ENCOUNTER — Encounter: Payer: BC Managed Care – PPO | Admitting: Women's Health

## 2014-07-11 ENCOUNTER — Telehealth: Payer: Self-pay | Admitting: *Deleted

## 2014-07-11 MED ORDER — FLUCONAZOLE 150 MG PO TABS: 150.0000 mg | ORAL_TABLET | Freq: Once | ORAL | Status: DC

## 2014-07-11 NOTE — Telephone Encounter (Signed)
Please call, best to try Diflucan 150 times one dose, if no relief office visit, can get yeast after taking Flagyl. Motrin for the cramping.

## 2014-07-11 NOTE — Telephone Encounter (Signed)
Nina Hill called c/o vaginal itching only no discharge, pt took flagyl 500 mg x 7 days on 06/19/14. Pt asked if you could send Rx to pharmacy for this. Discomfort from vaginal itching. Please advise

## 2014-07-11 NOTE — Telephone Encounter (Signed)
Pt had Mirena IUD removed on OV 06/30/14 called and left in triage of vaginal itching and discomfort. I left message for pt to call.

## 2014-07-11 NOTE — Telephone Encounter (Signed)
Left on pt voicemail Rx for diflucan sent to pharmacy. No need for motrin

## 2014-08-18 ENCOUNTER — Ambulatory Visit (INDEPENDENT_AMBULATORY_CARE_PROVIDER_SITE_OTHER): Payer: BC Managed Care – PPO | Admitting: Women's Health

## 2014-08-18 ENCOUNTER — Encounter: Payer: Self-pay | Admitting: Women's Health

## 2014-08-18 DIAGNOSIS — N898 Other specified noninflammatory disorders of vagina: Secondary | ICD-10-CM | POA: Diagnosis not present

## 2014-08-18 DIAGNOSIS — N760 Acute vaginitis: Secondary | ICD-10-CM | POA: Diagnosis not present

## 2014-08-18 DIAGNOSIS — A499 Bacterial infection, unspecified: Secondary | ICD-10-CM

## 2014-08-18 DIAGNOSIS — B9689 Other specified bacterial agents as the cause of diseases classified elsewhere: Secondary | ICD-10-CM

## 2014-08-18 LAB — WET PREP FOR TRICH, YEAST, CLUE
Trich, Wet Prep: NONE SEEN
Yeast Wet Prep HPF POC: NONE SEEN

## 2014-08-18 MED ORDER — METRONIDAZOLE 500 MG PO TABS: 500.0000 mg | ORAL_TABLET | Freq: Two times a day (BID) | ORAL | Status: DC

## 2014-08-18 NOTE — Progress Notes (Signed)
Patient ID: Nina Hill, female   DOB: 06-01-1980, 34 y.o.   MRN: [id number] Presents with complaint of low abdominal cramping, spotting week 3 of pill pack on second pack of Loestrin. Mirena IUD removed 06/30/2014 irregular spotting and pelvic pain. Negative GC/Chlamydia 06/2014. Denies urinary symptoms, fever.   Exam: Appears well. External genitalia within normal limits, speculum exam scant bloody discharge with odor noted, wet prep positive for amines, clues, TNTC bacteria. Bimanual mild CMT, no adnexal tenderness.  Bacteria vaginosis Irregular bleeding on Loestrin  Plan: Flagyl 500 twice twice daily for 7 days #14, alcohol precautions reviewed, will continue Loestrin, call if cycle does not regulate after third pack. Schedule annual exam.

## 2014-08-18 NOTE — Patient Instructions (Signed)
Bacterial Vaginosis Bacterial vaginosis is a vaginal infection that occurs when the normal balance of bacteria in the vagina is disrupted. It results from an overgrowth of certain bacteria. This is the most common vaginal infection in women of childbearing age. Treatment is important to prevent complications, especially in pregnant women, as it can cause a premature delivery. CAUSES  Bacterial vaginosis is caused by an increase in harmful bacteria that are normally present in smaller amounts in the vagina. Several different kinds of bacteria can cause bacterial vaginosis. However, the reason that the condition develops is not fully understood. RISK FACTORS Certain activities or behaviors can put you at an increased risk of developing bacterial vaginosis, including:  Having a new sex partner or multiple sex partners.  Douching.  Using an intrauterine device (IUD) for contraception. Women do not get bacterial vaginosis from toilet seats, bedding, swimming pools, or contact with objects around them. SIGNS AND SYMPTOMS  Some women with bacterial vaginosis have no signs or symptoms. Common symptoms include:  Grey vaginal discharge.  A fishlike odor with discharge, especially after sexual intercourse.  Itching or burning of the vagina and vulva.  Burning or pain with urination. DIAGNOSIS  Your health care provider will take a medical history and examine the vagina for signs of bacterial vaginosis. A sample of vaginal fluid may be taken. Your health care provider will look at this sample under a microscope to check for bacteria and abnormal cells. A vaginal pH test may also be done.  TREATMENT  Bacterial vaginosis may be treated with antibiotic medicines. These may be given in the form of a pill or a vaginal cream. A second round of antibiotics may be prescribed if the condition comes back after treatment.  HOME CARE INSTRUCTIONS   Only take over-the-counter or prescription medicines as  directed by your health care provider.  If antibiotic medicine was prescribed, take it as directed. Make sure you finish it even if you start to feel better.  Do not have sex until treatment is completed.  Tell all sexual partners that you have a vaginal infection. They should see their health care provider and be treated if they have problems, such as a mild rash or itching.  Practice safe sex by using condoms and only having one sex partner. SEEK MEDICAL CARE IF:   Your symptoms are not improving after 3 days of treatment.  You have increased discharge or pain.  You have a fever. MAKE SURE YOU:   Understand these instructions.  Will watch your condition.  Will get help right away if you are not doing well or get worse. FOR MORE INFORMATION  Centers for Disease Control and Prevention, Division of STD Prevention: www.cdc.gov/std American Sexual Health Association (ASHA): www.ashastd.org  Document Released: 04/28/2005 Document Revised: 02/16/2013 Document Reviewed: 12/08/2012 ExitCare Patient Information 2015 [employer]. This information is not intended to replace advice given to you by your health care provider. Make sure you discuss any questions you have with your health care provider.  

## 2014-08-28 ENCOUNTER — Telehealth: Payer: Self-pay

## 2014-08-28 NOTE — Telephone Encounter (Signed)
On 08/18/14 was prescribed Metronidazole.  Has finished that and now with some vaginal itching. Rx?

## 2014-08-29 ENCOUNTER — Other Ambulatory Visit: Payer: Self-pay | Admitting: Women's Health

## 2014-08-29 MED ORDER — FLUCONAZOLE 150 MG PO TABS: 150.0000 mg | ORAL_TABLET | Freq: Once | ORAL | Status: DC

## 2014-08-29 NOTE — Telephone Encounter (Signed)
Left message for patient that I called Rx in.

## 2014-08-29 NOTE — Telephone Encounter (Signed)
Okay, Diflucan 150 mg times one dose.

## 2014-09-25 ENCOUNTER — Telehealth: Payer: Self-pay | Admitting: *Deleted

## 2014-09-25 MED ORDER — FLUCONAZOLE 150 MG PO TABS: 150.0000 mg | ORAL_TABLET | Freq: Once | ORAL | Status: DC

## 2014-09-25 NOTE — Telephone Encounter (Signed)
Rx sent, left on voicemail Rx sent 

## 2014-09-25 NOTE — Telephone Encounter (Signed)
Diflucan 150mg 1 dose

## 2014-09-25 NOTE — Telephone Encounter (Signed)
(  You are back up MD) pt called c/o vaginal itching from taking antibiotic due to having dental work.  Would love if you could help relieve this with Rx. Please advise

## 2014-10-19 ENCOUNTER — Ambulatory Visit (INDEPENDENT_AMBULATORY_CARE_PROVIDER_SITE_OTHER): Payer: BC Managed Care – PPO | Admitting: Women's Health

## 2014-10-19 ENCOUNTER — Encounter: Payer: Self-pay | Admitting: Women's Health

## 2014-10-19 VITALS — BP 112/74 | Ht 62.0 in | Wt 142.0 lb

## 2014-10-19 DIAGNOSIS — B373 Candidiasis of vulva and vagina: Secondary | ICD-10-CM

## 2014-10-19 DIAGNOSIS — R350 Frequency of micturition: Secondary | ICD-10-CM | POA: Diagnosis not present

## 2014-10-19 DIAGNOSIS — B3731 Acute candidiasis of vulva and vagina: Secondary | ICD-10-CM

## 2014-10-19 DIAGNOSIS — N898 Other specified noninflammatory disorders of vagina: Secondary | ICD-10-CM

## 2014-10-19 LAB — WET PREP FOR TRICH, YEAST, CLUE
Clue Cells Wet Prep HPF POC: NONE SEEN
TRICH WET PREP: NONE SEEN

## 2014-10-19 LAB — URINALYSIS W MICROSCOPIC + REFLEX CULTURE
BILIRUBIN URINE: NEGATIVE
Casts: NONE SEEN
Crystals: NONE SEEN
Glucose, UA: NEGATIVE mg/dL
KETONES UR: NEGATIVE mg/dL
Leukocytes, UA: NEGATIVE
Nitrite: NEGATIVE
Protein, ur: NEGATIVE mg/dL
SPECIFIC GRAVITY, URINE: 1.015 (ref 1.005–1.030)
UROBILINOGEN UA: 0.2 mg/dL (ref 0.0–1.0)
WBC, UA: NONE SEEN WBC/hpf (ref <3)
pH: 5 (ref 5.0–8.0)

## 2014-10-19 MED ORDER — FLUCONAZOLE 150 MG PO TABS: ORAL_TABLET | ORAL | Status: DC

## 2014-10-19 NOTE — Patient Instructions (Signed)

## 2014-10-19 NOTE — Progress Notes (Signed)
Patient ID: Nina Hill, female   DOB: [birth year], 34 y.o.   MRN: [id number] Presents with complaint of increased vaginal discharge with itching and irritation. Some urinary frequency without pain or burning. Had been on 2 antibiotic's after root canal. Monthly cycle on Loestrin at end of cycle now. Same partner. Denies abdominal pain or fever.  Exam: Appears well. External genitalia within normal limits, speculum exam moderate amount of a curdy discharge with menses noted, wet prep positive for yeast. Bimanual no CMT or adnexal tenderness. UA large blood, 11-20 rbc's, rare bacteria.  Yeast vaginitis  Plan: Diflucan 150 by mouth today repeat in 3 days if needed, prescription, proper use given and reviewed yeast prevention. Long culture pending. Schedule annual exam.

## 2014-10-23 LAB — URINE CULTURE
COLONY COUNT: NO GROWTH
Organism ID, Bacteria: NO GROWTH

## 2014-11-28 ENCOUNTER — Encounter: Payer: BC Managed Care – PPO | Admitting: Women's Health

## 2015-01-09 ENCOUNTER — Other Ambulatory Visit: Payer: Self-pay | Admitting: Gynecology

## 2015-03-01 ENCOUNTER — Ambulatory Visit (INDEPENDENT_AMBULATORY_CARE_PROVIDER_SITE_OTHER): Payer: BC Managed Care – PPO | Admitting: Women's Health

## 2015-03-01 ENCOUNTER — Encounter: Payer: Self-pay | Admitting: Women's Health

## 2015-03-01 VITALS — BP 110/80 | Ht 62.0 in | Wt 155.0 lb

## 2015-03-01 DIAGNOSIS — Z23 Encounter for immunization: Secondary | ICD-10-CM | POA: Diagnosis not present

## 2015-03-01 DIAGNOSIS — Z113 Encounter for screening for infections with a predominantly sexual mode of transmission: Secondary | ICD-10-CM

## 2015-03-01 DIAGNOSIS — Z01419 Encounter for gynecological examination (general) (routine) without abnormal findings: Secondary | ICD-10-CM

## 2015-03-01 DIAGNOSIS — Z3041 Encounter for surveillance of contraceptive pills: Secondary | ICD-10-CM

## 2015-03-01 MED ORDER — DESOGESTREL-ETHINYL ESTRADIOL 0.15-0.02/0.01 MG (21/5) PO TABS: 1.0000 | ORAL_TABLET | Freq: Every day | ORAL | Status: DC

## 2015-03-01 MED ORDER — NORETHINDRONE ACET-ETHINYL EST 1-20 MG-MCG PO TABS
1.0000 | ORAL_TABLET | Freq: Every day | ORAL | Status: DC
Start: 2015-03-01 — End: 2015-03-01

## 2015-03-01 NOTE — Progress Notes (Signed)
Nina Hill 11-04-1980 [id number]    History:    Presents for annual exam.  Monthly cycle continues to have problems with acne and cramping. No further problems with spotting in between cycles. Normal Pap history.  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  ROS:  A ROS was performed and pertinent positives and negatives are included.  Exam:  Filed Vitals:   03/01/15 1602  BP: 110/80    General appearance:  Normal Thyroid:  Symmetrical, normal in size, without palpable masses or nodularity. Respiratory  Auscultation:  Clear without wheezing or rhonchi Cardiovascular  Auscultation:  Regular rate, without rubs, murmurs or gallops  Edema/varicosities:  Not grossly evident Abdominal  Soft,nontender, without masses, guarding or rebound.  Liver/spleen:  No organomegaly noted  Hernia:  None appreciated  Skin  Inspection:  Grossly normal   Breasts: Examined lying and sitting.     Right: Without masses, retractions, discharge or axillary adenopathy.     Left: Without masses, retractions, discharge or axillary adenopathy. Gentitourinary   Inguinal/mons:  Normal without inguinal adenopathy  External genitalia:  Normal  BUS/Urethra/Skene's glands:  Normal  Vagina:  Normal  Cervix:  Normal  Uterus:   normal in size, shape and contour.  Midline and mobile  Adnexa/parametria:     Rt: Without masses or tenderness.   Lt: Without masses or tenderness.  Anus and perineum: Normal  Digital rectal exam: Normal sphincter tone without palpated masses or tenderness  Assessment/Plan:  34 y.o. SBF G4 P0 for annual exam.    Monthly 4 days cycle with dysmenorrhea and acne STD screen Migraines without aura stable on Topamax  Plan: Options reviewed will try Mircette, prescription, proper use, slight risk for blood clots and strokes  reviewed. Condoms encouraged if sexually active. SBE's, exercise, calcium rich diet, MVI daily encouraged. CBC, lipid panel, glucose, UA, GC/Chlamydia, HIV, hep B, C, RPR. Pap normal 2015, new screening guidelines reviewed.     Huel Cote [facility], 4:58 PM 03/01/2015

## 2015-03-01 NOTE — Patient Instructions (Signed)
Health Maintenance, Female Adopting a healthy lifestyle and getting preventive care can go a long way to promote health and wellness. Talk with your health care provider about what schedule of regular examinations is right for you. This is a good chance for you to check in with your provider about disease prevention and staying healthy. In between checkups, there are plenty of things you can do on your own. Experts have done a lot of research about which lifestyle changes and preventive measures are most likely to keep you healthy. Ask your health care provider for more information. WEIGHT AND DIET  Eat a healthy diet  Be sure to include plenty of vegetables, fruits, low-fat dairy products, and lean protein.  Do not eat a lot of foods high in solid fats, added sugars, or salt.  Get regular exercise. This is one of the most important things you can do for your health.  Most adults should exercise for at least 150 minutes each week. The exercise should increase your heart rate and make you sweat (moderate-intensity exercise).  Most adults should also do strengthening exercises at least twice a week. This is in addition to the moderate-intensity exercise.  Maintain a healthy weight  Body mass index (BMI) is a measurement that can be used to identify possible weight problems. It estimates body fat based on height and weight. Your health care provider can help determine your BMI and help you achieve or maintain a healthy weight.  For females 28 years of age and older:   A BMI below 18.5 is considered underweight.  A BMI of 18.5 to 24.9 is normal.  A BMI of 25 to 29.9 is considered overweight.  A BMI of 30 and above is considered obese.  Watch levels of cholesterol and blood lipids  You should start having your blood tested for lipids and cholesterol at 35 years of age, then have this test every 5 years.  You may need to have your cholesterol levels checked more often if:  Your lipid  or cholesterol levels are high.  You are older than 35 years of age.  You are at high risk for heart disease.  CANCER SCREENING   Lung Cancer  Lung cancer screening is recommended for adults 75-66 years old who are at high risk for lung cancer because of a history of smoking.  A yearly low-dose CT scan of the lungs is recommended for people who:  Currently smoke.  Have quit within the past 15 years.  Have at least a 30-pack-year history of smoking. A pack year is smoking an average of one pack of cigarettes a day for 1 year.  Yearly screening should continue until it has been 15 years since you quit.  Yearly screening should stop if you develop a health problem that would prevent you from having lung cancer treatment.  Breast Cancer  Practice breast self-awareness. This means understanding how your breasts normally appear and feel.  It also means doing regular breast self-exams. Let your health care provider know about any changes, no matter how small.  If you are in your 20s or 30s, you should have a clinical breast exam (CBE) by a health care provider every 1-3 years as part of a regular health exam.  If you are 25 or older, have a CBE every year. Also consider having a breast X-ray (mammogram) every year.  If you have a family history of breast cancer, talk to your health care provider about genetic screening.  If you  are at high risk for breast cancer, talk to your health care provider about having an MRI and a mammogram every year.  Breast cancer gene (BRCA) assessment is recommended for women who have family members with BRCA-related cancers. BRCA-related cancers include:  Breast.  Ovarian.  Tubal.  Peritoneal cancers.  Results of the assessment will determine the need for genetic counseling and BRCA1 and BRCA2 testing. Cervical Cancer Your health care provider may recommend that you be screened regularly for cancer of the pelvic organs (ovaries, uterus, and  vagina). This screening involves a pelvic examination, including checking for microscopic changes to the surface of your cervix (Pap test). You may be encouraged to have this screening done every 3 years, beginning at age 21.  For women ages 30-65, health care providers may recommend pelvic exams and Pap testing every 3 years, or they may recommend the Pap and pelvic exam, combined with testing for human papilloma virus (HPV), every 5 years. Some types of HPV increase your risk of cervical cancer. Testing for HPV may also be done on women of any age with unclear Pap test results.  Other health care providers may not recommend any screening for nonpregnant women who are considered low risk for pelvic cancer and who do not have symptoms. Ask your health care provider if a screening pelvic exam is right for you.  If you have had past treatment for cervical cancer or a condition that could lead to cancer, you need Pap tests and screening for cancer for at least 20 years after your treatment. If Pap tests have been discontinued, your risk factors (such as having a new sexual partner) need to be reassessed to determine if screening should resume. Some women have medical problems that increase the chance of getting cervical cancer. In these cases, your health care provider may recommend more frequent screening and Pap tests. Colorectal Cancer  This type of cancer can be detected and often prevented.  Routine colorectal cancer screening usually begins at 35 years of age and continues through 35 years of age.  Your health care provider may recommend screening at an earlier age if you have risk factors for colon cancer.  Your health care provider may also recommend using home test kits to check for hidden blood in the stool.  A small camera at the end of a tube can be used to examine your colon directly (sigmoidoscopy or colonoscopy). This is done to check for the earliest forms of colorectal  cancer.  Routine screening usually begins at age 50.  Direct examination of the colon should be repeated every 5-10 years through 35 years of age. However, you may need to be screened more often if early forms of precancerous polyps or small growths are found. Skin Cancer  Check your skin from head to toe regularly.  Tell your health care provider about any new moles or changes in moles, especially if there is a change in a mole's shape or color.  Also tell your health care provider if you have a mole that is larger than the size of a pencil eraser.  Always use sunscreen. Apply sunscreen liberally and repeatedly throughout the day.  Protect yourself by wearing long sleeves, pants, a wide-brimmed hat, and sunglasses whenever you are outside. HEART DISEASE, DIABETES, AND HIGH BLOOD PRESSURE   High blood pressure causes heart disease and increases the risk of stroke. High blood pressure is more likely to develop in:  People who have blood pressure in the high end   of the normal range (130-139/85-89 mm Hg).  People who are overweight or obese.  People who are African American.  If you are 38-23 years of age, have your blood pressure checked every 3-5 years. If you are 61 years of age or older, have your blood pressure checked every year. You should have your blood pressure measured twice--once when you are at a hospital or clinic, and once when you are not at a hospital or clinic. Record the average of the two measurements. To check your blood pressure when you are not at a hospital or clinic, you can use:  An automated blood pressure machine at a pharmacy.  A home blood pressure monitor.  If you are between 45 years and 39 years old, ask your health care provider if you should take aspirin to prevent strokes.  Have regular diabetes screenings. This involves taking a blood sample to check your fasting blood sugar level.  If you are at a normal weight and have a low risk for diabetes,  have this test once every three years after 35 years of age.  If you are overweight and have a high risk for diabetes, consider being tested at a younger age or more often. PREVENTING INFECTION  Hepatitis B  If you have a higher risk for hepatitis B, you should be screened for this virus. You are considered at high risk for hepatitis B if:  You were born in a country where hepatitis B is common. Ask your health care provider which countries are considered high risk.  Your parents were born in a high-risk country, and you have not been immunized against hepatitis B (hepatitis B vaccine).  You have HIV or AIDS.  You use needles to inject street drugs.  You live with someone who has hepatitis B.  You have had sex with someone who has hepatitis B.  You get hemodialysis treatment.  You take certain medicines for conditions, including cancer, organ transplantation, and autoimmune conditions. Hepatitis C  Blood testing is recommended for:  Everyone born from [birth year] through 1966.  Anyone with known risk factors for hepatitis C. Sexually transmitted infections (STIs)  You should be screened for sexually transmitted infections (STIs) including gonorrhea and chlamydia if:  You are sexually active and are younger than 35 years of age.  You are older than 35 years of age and your health care provider tells you that you are at risk for this type of infection.  Your sexual activity has changed since you were last screened and you are at an increased risk for chlamydia or gonorrhea. Ask your health care provider if you are at risk.  If you do not have HIV, but are at risk, it may be recommended that you take a prescription medicine daily to prevent HIV infection. This is called pre-exposure prophylaxis (PrEP). You are considered at risk if:  You are sexually active and do not regularly use condoms or know the HIV status of your partner(s).  You take drugs by injection.  You are sexually  active with a partner who has HIV. Talk with your health care provider about whether you are at high risk of being infected with HIV. If you choose to begin PrEP, you should first be tested for HIV. You should then be tested every 3 months for as long as you are taking PrEP.  PREGNANCY   If you are premenopausal and you may become pregnant, ask your health care provider about preconception counseling.  If you may  become pregnant, take 400 to 800 micrograms (mcg) of folic acid every day.  If you want to prevent pregnancy, talk to your health care provider about birth control (contraception). OSTEOPOROSIS AND MENOPAUSE   Osteoporosis is a disease in which the bones lose minerals and strength with aging. This can result in serious bone fractures. Your risk for osteoporosis can be identified using a bone density scan.  If you are 61 years of age or older, or if you are at risk for osteoporosis and fractures, ask your health care provider if you should be screened.  Ask your health care provider whether you should take a calcium or vitamin D supplement to lower your risk for osteoporosis.  Menopause may have certain physical symptoms and risks.  Hormone replacement therapy may reduce some of these symptoms and risks. Talk to your health care provider about whether hormone replacement therapy is right for you.  HOME CARE INSTRUCTIONS   Schedule regular health, dental, and eye exams.  Stay current with your immunizations.   Do not use any tobacco products including cigarettes, chewing tobacco, or electronic cigarettes.  If you are pregnant, do not drink alcohol.  If you are breastfeeding, limit how much and how often you drink alcohol.  Limit alcohol intake to no more than 1 drink per day for nonpregnant women. One drink equals 12 ounces of beer, 5 ounces of wine, or 1 ounces of hard liquor.  Do not use street drugs.  Do not share needles.  Ask your health care provider for help if  you need support or information about quitting drugs.  Tell your health care provider if you often feel depressed.  Tell your health care provider if you have ever been abused or do not feel safe at home.   This information is not intended to replace advice given to you by your health care provider. Make sure you discuss any questions you have with your health care provider.   Document Released: 11/11/2010 Document Revised: 05/19/2014 Document Reviewed: 03/30/2013 Elsevier Interactive Patient Education [employer].

## 2015-03-02 LAB — URINALYSIS W MICROSCOPIC + REFLEX CULTURE
Bacteria, UA: NONE SEEN [HPF]
Bilirubin Urine: NEGATIVE
CASTS: NONE SEEN [LPF]
CRYSTALS: NONE SEEN [HPF]
Glucose, UA: NEGATIVE
HGB URINE DIPSTICK: NEGATIVE
KETONES UR: NEGATIVE
Leukocytes, UA: NEGATIVE
Nitrite: NEGATIVE
PH: 6 (ref 5.0–8.0)
Protein, ur: NEGATIVE
RBC / HPF: NONE SEEN RBC/HPF (ref <=2)
Specific Gravity, Urine: 1.018 (ref 1.001–1.035)
WBC, UA: NONE SEEN WBC/HPF (ref <=5)
Yeast: NONE SEEN [HPF]

## 2015-03-02 LAB — GLUCOSE, RANDOM: GLUCOSE: 88 mg/dL (ref 65–99)

## 2015-03-02 LAB — CBC WITH DIFFERENTIAL/PLATELET
BASOS PCT: 1 % (ref 0–1)
Basophils Absolute: 0.1 10*3/uL (ref 0.0–0.1)
Eosinophils Absolute: 0.1 10*3/uL (ref 0.0–0.7)
Eosinophils Relative: 2 % (ref 0–5)
HEMATOCRIT: 40.3 % (ref 36.0–46.0)
HEMOGLOBIN: 13 g/dL (ref 12.0–15.0)
LYMPHS PCT: 43 % (ref 12–46)
Lymphs Abs: 3 10*3/uL (ref 0.7–4.0)
MCH: 32.7 pg (ref 26.0–34.0)
MCHC: 32.3 g/dL (ref 30.0–36.0)
MCV: 101.3 fL — ABNORMAL HIGH (ref 78.0–100.0)
MONOS PCT: 5 % (ref 3–12)
MPV: 9.6 fL (ref 8.6–12.4)
Monocytes Absolute: 0.3 10*3/uL (ref 0.1–1.0)
NEUTROS ABS: 3.4 10*3/uL (ref 1.7–7.7)
NEUTROS PCT: 49 % (ref 43–77)
Platelets: 307 10*3/uL (ref 150–400)
RBC: 3.98 MIL/uL (ref 3.87–5.11)
RDW: 13.4 % (ref 11.5–15.5)
WBC: 6.9 10*3/uL (ref 4.0–10.5)

## 2015-03-02 LAB — LIPID PANEL
CHOL/HDL RATIO: 2.8 ratio (ref <=5.0)
CHOLESTEROL: 172 mg/dL (ref 125–200)
HDL: 62 mg/dL (ref >=46)
LDL Cholesterol: 99 mg/dL (ref <130)
Triglycerides: 55 mg/dL (ref <150)
VLDL: 11 mg/dL (ref <30)

## 2015-03-02 LAB — GC/CHLAMYDIA PROBE AMP
CT PROBE, AMP APTIMA: NEGATIVE
GC PROBE AMP APTIMA: NEGATIVE

## 2015-03-03 LAB — HEPATITIS C ANTIBODY: HCV Ab: NEGATIVE

## 2015-03-03 LAB — HEPATITIS B SURFACE ANTIGEN: Hepatitis B Surface Ag: NEGATIVE

## 2015-03-03 LAB — RPR

## 2015-03-03 LAB — HIV ANTIBODY (ROUTINE TESTING W REFLEX): HIV 1&2 Ab, 4th Generation: NONREACTIVE

## 2016-01-25 ENCOUNTER — Ambulatory Visit (INDEPENDENT_AMBULATORY_CARE_PROVIDER_SITE_OTHER): Payer: BC Managed Care – PPO | Admitting: Women's Health

## 2016-01-25 ENCOUNTER — Encounter: Payer: Self-pay | Admitting: Women's Health

## 2016-01-25 VITALS — BP 122/80 | Ht 62.0 in | Wt 155.0 lb

## 2016-01-25 DIAGNOSIS — A499 Bacterial infection, unspecified: Secondary | ICD-10-CM

## 2016-01-25 DIAGNOSIS — N760 Acute vaginitis: Secondary | ICD-10-CM

## 2016-01-25 DIAGNOSIS — Z113 Encounter for screening for infections with a predominantly sexual mode of transmission: Secondary | ICD-10-CM | POA: Diagnosis not present

## 2016-01-25 DIAGNOSIS — B9689 Other specified bacterial agents as the cause of diseases classified elsewhere: Secondary | ICD-10-CM

## 2016-01-25 DIAGNOSIS — N898 Other specified noninflammatory disorders of vagina: Secondary | ICD-10-CM

## 2016-01-25 DIAGNOSIS — N912 Amenorrhea, unspecified: Secondary | ICD-10-CM

## 2016-01-25 DIAGNOSIS — R350 Frequency of micturition: Secondary | ICD-10-CM

## 2016-01-25 LAB — WET PREP FOR TRICH, YEAST, CLUE
Trich, Wet Prep: NONE SEEN
WBC WET PREP: NONE SEEN
Yeast Wet Prep HPF POC: NONE SEEN

## 2016-01-25 LAB — PREGNANCY, URINE: Preg Test, Ur: NEGATIVE

## 2016-01-25 MED ORDER — METRONIDAZOLE 500 MG PO TABS: 500.0000 mg | ORAL_TABLET | Freq: Two times a day (BID) | ORAL | 0 refills | Status: DC

## 2016-01-25 MED ORDER — FLUCONAZOLE 150 MG PO TABS: 150.0000 mg | ORAL_TABLET | Freq: Once | ORAL | 1 refills | Status: AC

## 2016-01-25 NOTE — Patient Instructions (Signed)

## 2016-01-25 NOTE — Progress Notes (Signed)
Presents with complaints of vaginal itching, irritation, and malodorous discharge, which began a week ago. Tried OTC Vagisil with no relief, dislikes vaginal creams. New partner, endorses condom use, OCPs for contraception. Mentioned new bubble bath product which may have caused some irritation. Denies urinary complaints, pelvic/abdominal pain, or fevers.   Exam: Appears well. External genitalia within normal limits. Speculum exam non-erythematous vaginal walls and cervix, small amount of white adherent discharge. No odor. Urine pregnancy negative. Wet prep many bacteria and few clue cells.  UA negative.   Bacterial Vaginosis   Plan: Flagyl 500 mg BID for 7 days. Discussed alcohol precautions. Diflucan 150 mg once to be used PRN. Encouraged condom use until permanent partner. Annual scheduled for October, instructed to call if continued symptoms or other problems before then. GC/Chlamydia culture pending. Will check HIV, hepatitis RPR at annual exam.

## 2016-01-26 LAB — GC/CHLAMYDIA PROBE AMP
CT Probe RNA: NOT DETECTED
GC Probe RNA: NOT DETECTED

## 2016-02-22 ENCOUNTER — Telehealth: Payer: Self-pay | Admitting: *Deleted

## 2016-02-22 MED ORDER — FLUCONAZOLE 150 MG PO TABS: 150.0000 mg | ORAL_TABLET | Freq: Once | ORAL | 0 refills | Status: AC

## 2016-02-22 NOTE — Telephone Encounter (Signed)
Okay for Diflucan 150 mg 1 tablet office visit if no relief

## 2016-02-22 NOTE — Telephone Encounter (Signed)
Rx sent 

## 2016-02-22 NOTE — Telephone Encounter (Signed)
Left detailed message on pt voicemail per DPR access 

## 2016-02-22 NOTE — Telephone Encounter (Signed)
Pt called c/o vaginal itch x 3 days now, was treated for BV in september. Asked if diflucan tablet could be sent to pharmacy? Please advise

## 2016-03-15 ENCOUNTER — Other Ambulatory Visit: Payer: Self-pay | Admitting: Women's Health

## 2016-03-15 DIAGNOSIS — Z3041 Encounter for surveillance of contraceptive pills: Secondary | ICD-10-CM

## 2016-03-19 ENCOUNTER — Telehealth: Payer: Self-pay | Admitting: *Deleted

## 2016-03-19 NOTE — Telephone Encounter (Signed)
Pt called and left message in triage voicemail states we were withholding her birth control because it has been denied, pt overdue for annual, I called patient back and received her voicemail and left that all she would need to due is schedule annual exam and Rx could be sent. I will wait to see if patient schedules

## 2016-03-24 MED ORDER — NORETHINDRONE ACET-ETHINYL EST 1-20 MG-MCG PO TABS: 1.0000 | ORAL_TABLET | Freq: Every day | ORAL | 0 refills | Status: DC

## 2016-03-24 NOTE — Telephone Encounter (Signed)
Pt has annual on 04/02/16. Rx sent.

## 2016-04-02 ENCOUNTER — Ambulatory Visit (INDEPENDENT_AMBULATORY_CARE_PROVIDER_SITE_OTHER): Payer: BC Managed Care – PPO | Admitting: Women's Health

## 2016-04-02 ENCOUNTER — Encounter: Payer: Self-pay | Admitting: Women's Health

## 2016-04-02 VITALS — BP 110/80 | Ht 62.0 in | Wt 146.0 lb

## 2016-04-02 DIAGNOSIS — Z01419 Encounter for gynecological examination (general) (routine) without abnormal findings: Secondary | ICD-10-CM

## 2016-04-02 DIAGNOSIS — B9689 Other specified bacterial agents as the cause of diseases classified elsewhere: Secondary | ICD-10-CM

## 2016-04-02 DIAGNOSIS — Z13220 Encounter for screening for lipoid disorders: Secondary | ICD-10-CM | POA: Diagnosis not present

## 2016-04-02 DIAGNOSIS — N760 Acute vaginitis: Secondary | ICD-10-CM | POA: Diagnosis not present

## 2016-04-02 LAB — CBC WITH DIFFERENTIAL/PLATELET
BASOS PCT: 1 %
Basophils Absolute: 72 cells/uL (ref 0–200)
EOS ABS: 144 {cells}/uL (ref 15–500)
Eosinophils Relative: 2 %
HEMATOCRIT: 38.5 % (ref 35.0–45.0)
HEMOGLOBIN: 12.5 g/dL (ref 11.7–15.5)
LYMPHS ABS: 2520 {cells}/uL (ref 850–3900)
LYMPHS PCT: 35 %
MCH: 32.1 pg (ref 27.0–33.0)
MCHC: 32.5 g/dL (ref 32.0–36.0)
MCV: 98.7 fL (ref 80.0–100.0)
MONO ABS: 288 {cells}/uL (ref 200–950)
MPV: 9.5 fL (ref 7.5–12.5)
Monocytes Relative: 4 %
Neutro Abs: 4176 cells/uL (ref 1500–7800)
Neutrophils Relative %: 58 %
Platelets: 304 10*3/uL (ref 140–400)
RBC: 3.9 MIL/uL (ref 3.80–5.10)
RDW: 12.8 % (ref 11.0–15.0)
WBC: 7.2 10*3/uL (ref 3.8–10.8)

## 2016-04-02 LAB — WET PREP FOR TRICH, YEAST, CLUE
Trich, Wet Prep: NONE SEEN
WBC, Wet Prep HPF POC: NONE SEEN
Yeast Wet Prep HPF POC: NONE SEEN

## 2016-04-02 LAB — LIPID PANEL
CHOL/HDL RATIO: 2.6 ratio (ref <5.0)
Cholesterol: 151 mg/dL (ref <200)
HDL: 58 mg/dL (ref >50)
LDL CALC: 82 mg/dL (ref <100)
Triglycerides: 57 mg/dL (ref <150)
VLDL: 11 mg/dL (ref <30)

## 2016-04-02 LAB — GLUCOSE, RANDOM: Glucose, Bld: 84 mg/dL (ref 65–99)

## 2016-04-02 MED ORDER — NORETHINDRONE ACET-ETHINYL EST 1-20 MG-MCG PO TABS: 1.0000 | ORAL_TABLET | Freq: Every day | ORAL | 4 refills | Status: DC

## 2016-04-02 MED ORDER — METRONIDAZOLE 500 MG PO TABS: 500.0000 mg | ORAL_TABLET | Freq: Two times a day (BID) | ORAL | 0 refills | Status: DC

## 2016-04-02 NOTE — Patient Instructions (Signed)

## 2016-04-02 NOTE — Progress Notes (Signed)
Nina Hill Jun 26, 1980 [id number]    History:    Presents for annual exam.  Monthly cycle on Loestrin without complaint. History of migraines without aura which are better. Normal Pap history. Same partner negative STD screen.  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  ROS:  A ROS was performed and pertinent positives and negatives are included.  Exam:  Vitals:   04/02/16 1025  BP: 110/80  Weight: 146 lb (66.2 kg)  Height: 5\' 2"  (1.575 m)   Body mass index is 26.7 kg/m.   General appearance:  Normal Thyroid:  Symmetrical, normal in size, without palpable masses or nodularity. Respiratory  Auscultation:  Clear without wheezing or rhonchi Cardiovascular  Auscultation:  Regular rate, without rubs, murmurs or gallops  Edema/varicosities:  Not grossly evident Abdominal  Soft,nontender, without masses, guarding or rebound.  Liver/spleen:  No organomegaly noted  Hernia:  None appreciated  Skin  Inspection:  Grossly normal   Breasts: Examined lying and sitting.     Right: Without masses, retractions, discharge or axillary adenopathy.     Left: 2:00 position 2 cm mobile smooth probable fatty tissue or fibroadenoma, no retractions, discharge or axillary adenopathy. Gentitourinary   Inguinal/mons:  Normal without inguinal adenopathy  External genitalia:  Normal  BUS/Urethra/Skene's glands:  Normal  Vagina:  Moderate white adherent discharge wet prep positive for moderate clues, TNTC bacteria              Cervix:  Normal  Uterus:   normal in size, shape and contour.  Midline and mobile  Adnexa/parametria:     Rt: Without masses or tenderness.   Lt: Without masses or tenderness.  Anus and perineum: Normal  Digital rectal exam: Normal sphincter tone without palpated masses or tenderness  Assessment/Plan:   35 y.o. SBF G4 P0  for annual exam.    Monthly cycle on Loestrin Bacteria vaginosis Left breast nodule  Plan: Schedule diagnostic mammogram with ultrasound for left breast rule out fibroadenoma. SBE's, regular exercise, calcium rich diet, MVI daily encouraged. Loestrin 1/20 prescription, proper use given and reviewed slight risk for blood clots and strokes. Flagyl 500 twice daily for 7 days #14 prescription, proper use, alcohol precautions reviewed. Instructed to call for no relief of discharge. CBC, UA, Pap with HR HPV typing, new screening guidelines reviewed.    Huel Cote [facility], 12:06 PM 04/02/2016

## 2016-04-03 LAB — URINALYSIS W MICROSCOPIC + REFLEX CULTURE
BILIRUBIN URINE: NEGATIVE
Bacteria, UA: NONE SEEN [HPF]
Casts: NONE SEEN [LPF]
Crystals: NONE SEEN [HPF]
GLUCOSE, UA: NEGATIVE
Hgb urine dipstick: NEGATIVE
Ketones, ur: NEGATIVE
LEUKOCYTES UA: NEGATIVE
Nitrite: NEGATIVE
Protein, ur: NEGATIVE
RBC / HPF: NONE SEEN RBC/HPF (ref <=2)
SPECIFIC GRAVITY, URINE: 1.018 (ref 1.001–1.035)
Yeast: NONE SEEN [HPF]
pH: 7.5 (ref 5.0–8.0)

## 2016-04-04 LAB — URINE CULTURE

## 2016-04-07 ENCOUNTER — Telehealth: Payer: Self-pay | Admitting: *Deleted

## 2016-04-07 DIAGNOSIS — D242 Benign neoplasm of left breast: Secondary | ICD-10-CM

## 2016-04-07 NOTE — Telephone Encounter (Signed)
-----   Message from Huel Cote, NP sent at 04/02/2016  1:02 PM EST ----- Needs diagnostic mammogram with ultrasound questionable left breast fibroadenoma at 2:00 position 2 cm. 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 No family history of breast cancer.

## 2016-04-07 NOTE — Telephone Encounter (Signed)
Appointment on 04/10/16 @ 2:50pm pt informed.

## 2016-04-07 NOTE — Telephone Encounter (Signed)
Orders placed at breast center they will contact pt to schedule. 

## 2016-04-08 LAB — PAP, TP IMAGING W/ HPV RNA, RFLX HPV TYPE 16,18/45: HPV MRNA, HIGH RISK: NOT DETECTED

## 2016-04-10 ENCOUNTER — Other Ambulatory Visit: Payer: BC Managed Care – PPO

## 2016-04-11 ENCOUNTER — Ambulatory Visit
Admission: RE | Admit: 2016-04-11 | Discharge: 2016-04-11 | Disposition: A | Discharge status: Home / Self Care | Payer: BC Managed Care – PPO | Source: Ambulatory Visit | Attending: Women's Health | Admitting: Women's Health

## 2016-04-11 ENCOUNTER — Other Ambulatory Visit: Payer: Self-pay | Admitting: Women's Health

## 2016-04-11 ENCOUNTER — Telehealth: Payer: Self-pay

## 2016-04-11 DIAGNOSIS — D242 Benign neoplasm of left breast: Secondary | ICD-10-CM

## 2016-04-11 DIAGNOSIS — N6310 Unspecified lump in the right breast, unspecified quadrant: Secondary | ICD-10-CM

## 2016-04-11 NOTE — Telephone Encounter (Signed)
[facility] tech called. Patient is there. Just had mammo and radiologist recommends breast u/s and they need order. They have sent EPIC order for [place] to sign. I gave them verbal ok to proceed assuring them that you would want them to and told them that you would sign order and get it back to them. They are sending paper order for you to sign as well.

## 2016-09-24 ENCOUNTER — Encounter: Payer: Self-pay | Admitting: Gynecology

## 2017-01-19 ENCOUNTER — Ambulatory Visit (INDEPENDENT_AMBULATORY_CARE_PROVIDER_SITE_OTHER): Payer: BC Managed Care – PPO | Admitting: Women's Health

## 2017-01-19 ENCOUNTER — Encounter: Payer: Self-pay | Admitting: Women's Health

## 2017-01-19 VITALS — BP 114/70

## 2017-01-19 DIAGNOSIS — B9689 Other specified bacterial agents as the cause of diseases classified elsewhere: Secondary | ICD-10-CM

## 2017-01-19 DIAGNOSIS — R350 Frequency of micturition: Secondary | ICD-10-CM

## 2017-01-19 DIAGNOSIS — N760 Acute vaginitis: Secondary | ICD-10-CM

## 2017-01-19 DIAGNOSIS — N898 Other specified noninflammatory disorders of vagina: Secondary | ICD-10-CM | POA: Diagnosis not present

## 2017-01-19 LAB — WET PREP FOR TRICH, YEAST, CLUE

## 2017-01-19 MED ORDER — METRONIDAZOLE 500 MG PO TABS
500.0000 mg | ORAL_TABLET | Freq: Two times a day (BID) | ORAL | 0 refills | Status: DC
Start: 2017-01-19 — End: 2018-10-13

## 2017-01-19 MED ORDER — PHENAZOPYRIDINE HCL 200 MG PO TABS: 200.0000 mg | ORAL_TABLET | Freq: Three times a day (TID) | ORAL | 0 refills | Status: DC | PRN

## 2017-01-19 NOTE — Progress Notes (Signed)
Presents with complaint of increased urinary frequency, urgency and low abdominal cramping for the past week. Scant white discharge without itching or odor. Same partner. Monthly cycle on Loestrin. [employer] grade teacher. Denies back ache, states had a temperature of 99.5 last night. Also has had increased fatigue.  Exam: Appears well. CVAT. External genitalia within normal limits, speculum exam moderate white adherent discharge with odor noted, wet prep positive for clues, bimanual no CMT or adnexal tenderness. UA: Negative leukocytes, negative blood, no wbc's, no RBCs, many bacteria,  Bacterial vaginosis  Urinary frequency Fatigue  Plan: Urine culture pending. Pyridium 200 mg 3 times daily as needed. Prescription given. Flagyl 500 twice daily for 7 days, alcohol precautions reviewed. Reviewed importance of increasing fluids, sleep hygiene reviewed, multivitamin daily encouraged. Instructed to call if continued problems.

## 2017-01-19 NOTE — Patient Instructions (Signed)

## 2017-01-20 LAB — URINALYSIS, ROUTINE W REFLEX MICROSCOPIC
Bilirubin Urine: NEGATIVE
GLUCOSE, UA: NEGATIVE
HGB URINE DIPSTICK: NEGATIVE
HYALINE CAST: NONE SEEN /LPF
Ketones, ur: NEGATIVE
Leukocytes, UA: NEGATIVE
NITRITE: NEGATIVE
PROTEIN: NEGATIVE
RBC / HPF: NONE SEEN /HPF (ref 0–2)
Specific Gravity, Urine: 1.015 (ref 1.001–1.03)
WBC, UA: NONE SEEN /HPF (ref 0–5)
pH: 7 (ref 5.0–8.0)

## 2017-06-17 ENCOUNTER — Encounter: Payer: Self-pay | Admitting: Women's Health

## 2017-06-17 ENCOUNTER — Ambulatory Visit: Payer: BC Managed Care – PPO | Admitting: Women's Health

## 2017-06-17 VITALS — BP 115/78 | Ht 64.0 in | Wt 171.2 lb

## 2017-06-17 DIAGNOSIS — Z01419 Encounter for gynecological examination (general) (routine) without abnormal findings: Secondary | ICD-10-CM

## 2017-06-17 DIAGNOSIS — B9689 Other specified bacterial agents as the cause of diseases classified elsewhere: Secondary | ICD-10-CM

## 2017-06-17 DIAGNOSIS — N898 Other specified noninflammatory disorders of vagina: Secondary | ICD-10-CM

## 2017-06-17 DIAGNOSIS — N760 Acute vaginitis: Secondary | ICD-10-CM

## 2017-06-17 LAB — CBC WITH DIFFERENTIAL/PLATELET
BASOS ABS: 83 {cells}/uL (ref 0–200)
Basophils Relative: 1.1 %
EOS ABS: 210 {cells}/uL (ref 15–500)
EOS PCT: 2.8 %
HEMATOCRIT: 35.2 % (ref 35.0–45.0)
HEMOGLOBIN: 12 g/dL (ref 11.7–15.5)
LYMPHS ABS: 3180 {cells}/uL (ref 850–3900)
MCH: 33.1 pg — ABNORMAL HIGH (ref 27.0–33.0)
MCHC: 34.1 g/dL (ref 32.0–36.0)
MCV: 97 fL (ref 80.0–100.0)
MPV: 10.4 fL (ref 7.5–12.5)
Monocytes Relative: 6 %
NEUTROS ABS: 3578 {cells}/uL (ref 1500–7800)
Neutrophils Relative %: 47.7 %
Platelets: 241 10*3/uL (ref 140–400)
RBC: 3.63 10*6/uL — ABNORMAL LOW (ref 3.80–5.10)
RDW: 12 % (ref 11.0–15.0)
Total Lymphocyte: 42.4 %
WBC: 7.5 10*3/uL (ref 3.8–10.8)
WBCMIX: 450 {cells}/uL (ref 200–950)

## 2017-06-17 LAB — WET PREP FOR TRICH, YEAST, CLUE

## 2017-06-17 MED ORDER — METRONIDAZOLE 0.75 % VA GEL: VAGINAL | 0 refills | Status: DC

## 2017-06-17 NOTE — Progress Notes (Signed)
Nina Hill 04/14/1981 [id number]    History:    Presents for annual exam. Monthly cycle using no contraception desiring conception. Normal Pap history. Same partner. Migraines on Topamax. Neurologist, currently decreasing dose and doing well.  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  Parents, sister hypertension.  ROS:  A ROS was performed and pertinent positives and negatives are included.  Exam:  Vitals:   06/17/17 1536  BP: 115/78  Weight: 171 lb 3.2 oz (77.7 kg)  Height: 5\' 4"  (1.626 m)   Body mass index is 29.39 kg/m.   General appearance:  Normal Thyroid:  Symmetrical, normal in size, without palpable masses or nodularity. Respiratory  Auscultation:  Clear without wheezing or rhonchi Cardiovascular  Auscultation:  Regular rate, without rubs, murmurs or gallops  Edema/varicosities:  Not grossly evident Abdominal  Soft,nontender, without masses, guarding or rebound.  Liver/spleen:  No organomegaly noted  Hernia:  None appreciated  Skin  Inspection:  Grossly normal   Breasts: Examined lying and sitting.     Right: Without masses, retractions, discharge or axillary adenopathy.     Left: Without masses, retractions, discharge or axillary adenopathy. Gentitourinary   Inguinal/mons:  Normal without inguinal adenopathy  External genitalia:  Normal  BUS/Urethra/Skene's glands:  Normal  Vagina:  Normal  Cervix:  Normal  Uterus:  normal in size, shape and contour.  Midline and mobile  Adnexa/parametria:     Rt: Without masses or tenderness.   Lt: Without masses or tenderness.  Anus and perineum: Normal  Digital rectal exam: Normal sphincter tone without palpated masses or tenderness  Assessment/Plan:  37 y.o. SBF G4 P0  for annual exam with complaint of discharge with odor.  Monthly cycle/desiring conception Bacteria vaginosis Migraines on Topamax per  neurologist  Plan: MetroGel vaginal cream 1 applicator at bedtime 5, alcohol precautions reviewed. Instructed to call if no relief of symptoms. SBE's, exercise, calcium rich diet, MVI daily encouraged. Reviewed safe pregnancy behaviors. Return to office with missed cycle. Cutting back on Topamax at this point, reviewed best not to be on with pregnancy. Aware we no longer deliver, we'll get viability ultrasound and get referral. CBC. Normal Pap 04/2016, new screening guidelines reviewed.  [place], 4:57 PM 06/17/2017

## 2017-06-17 NOTE — Patient Instructions (Addendum)
Health Maintenance, Female Adopting a healthy lifestyle and getting preventive care can go a long way to promote health and wellness. Talk with your health care provider about what schedule of regular examinations is right for you. This is a good chance for you to check in with your provider about disease prevention and staying healthy. In between checkups, there are plenty of things you can do on your own. Experts have done a lot of research about which lifestyle changes and preventive measures are most likely to keep you healthy. Ask your health care provider for more information. Weight and diet Eat a healthy diet  Be sure to include plenty of vegetables, fruits, low-fat dairy products, and lean protein.  Do not eat a lot of foods high in solid fats, added sugars, or salt.  Get regular exercise. This is one of the most important things you can do for your health. ? Most adults should exercise for at least 150 minutes each week. The exercise should increase your heart rate and make you sweat (moderate-intensity exercise). ? Most adults should also do strengthening exercises at least twice a week. This is in addition to the moderate-intensity exercise.  Maintain a healthy weight  Body mass index (BMI) is a measurement that can be used to identify possible weight problems. It estimates body fat based on height and weight. Your health care provider can help determine your BMI and help you achieve or maintain a healthy weight.  For females 38 years of age and older: ? A BMI below 18.5 is considered underweight. ? A BMI of 18.5 to 24.9 is normal. ? A BMI of 25 to 29.9 is considered overweight. ? A BMI of 30 and above is considered obese.  Watch levels of cholesterol and blood lipids  You should start having your blood tested for lipids and cholesterol at 38 years of age, then have this test every 5 years.  You may need to have your cholesterol levels checked more often if: ? Your lipid or  cholesterol levels are high. ? You are older than 38 years of age. ? You are at high risk for heart disease.  Cancer screening Lung Cancer  Lung cancer screening is recommended for adults 13-3 years old who are at high risk for lung cancer because of a history of smoking.  A yearly low-dose CT scan of the lungs is recommended for people who: ? Currently smoke. ? Have quit within the past 15 years. ? Have at least a 30-pack-year history of smoking. A pack year is smoking an average of one pack of cigarettes a day for 1 year.  Yearly screening should continue until it has been 15 years since you quit.  Yearly screening should stop if you develop a health problem that would prevent you from having lung cancer treatment.  Breast Cancer  Practice breast self-awareness. This means understanding how your breasts normally appear and feel.  It also means doing regular breast self-exams. Let your health care provider know about any changes, no matter how small.  If you are in your 20s or 30s, you should have a clinical breast exam (CBE) by a health care provider every 1-3 years as part of a regular health exam.  If you are 54 or older, have a CBE every year. Also consider having a breast X-ray (mammogram) every year.  If you have a family history of breast cancer, talk to your health care provider about genetic screening.  If you are at high risk  for breast cancer, talk to your health care provider about having an MRI and a mammogram every year.  Breast cancer gene (BRCA) assessment is recommended for women who have family members with BRCA-related cancers. BRCA-related cancers include: ? Breast. ? Ovarian. ? Tubal. ? Peritoneal cancers.  Results of the assessment will determine the need for genetic counseling and BRCA1 and BRCA2 testing.  Cervical Cancer Your health care provider may recommend that you be screened regularly for cancer of the pelvic organs (ovaries, uterus, and  vagina). This screening involves a pelvic examination, including checking for microscopic changes to the surface of your cervix (Pap test). You may be encouraged to have this screening done every 3 years, beginning at age 22.  For women ages 56-65, health care providers may recommend pelvic exams and Pap testing every 3 years, or they may recommend the Pap and pelvic exam, combined with testing for human papilloma virus (HPV), every 5 years. Some types of HPV increase your risk of cervical cancer. Testing for HPV may also be done on women of any age with unclear Pap test results.  Other health care providers may not recommend any screening for nonpregnant women who are considered low risk for pelvic cancer and who do not have symptoms. Ask your health care provider if a screening pelvic exam is right for you.  If you have had past treatment for cervical cancer or a condition that could lead to cancer, you need Pap tests and screening for cancer for at least 20 years after your treatment. If Pap tests have been discontinued, your risk factors (such as having a new sexual partner) need to be reassessed to determine if screening should resume. Some women have medical problems that increase the chance of getting cervical cancer. In these cases, your health care provider may recommend more frequent screening and Pap tests.  Colorectal Cancer  This type of cancer can be detected and often prevented.  Routine colorectal cancer screening usually begins at 38 years of age and continues through 38 years of age.  Your health care provider may recommend screening at an earlier age if you have risk factors for colon cancer.  Your health care provider may also recommend using home test kits to check for hidden blood in the stool.  A small camera at the end of a tube can be used to examine your colon directly (sigmoidoscopy or colonoscopy). This is done to check for the earliest forms of colorectal  cancer.  Routine screening usually begins at age 33.  Direct examination of the colon should be repeated every 5-10 years through 38 years of age. However, you may need to be screened more often if early forms of precancerous polyps or small growths are found.  Skin Cancer  Check your skin from head to toe regularly.  Tell your health care provider about any new moles or changes in moles, especially if there is a change in a mole's shape or color.  Also tell your health care provider if you have a mole that is larger than the size of a pencil eraser.  Always use sunscreen. Apply sunscreen liberally and repeatedly throughout the day.  Protect yourself by wearing long sleeves, pants, a wide-brimmed hat, and sunglasses whenever you are outside.  Heart disease, diabetes, and high blood pressure  High blood pressure causes heart disease and increases the risk of stroke. High blood pressure is more likely to develop in: ? People who have blood pressure in the high end of  the normal range (130-139/85-89 mm Hg). ? People who are overweight or obese. ? People who are African American.  If you are 74-77 years of age, have your blood pressure checked every 3-5 years. If you are 61 years of age or older, have your blood pressure checked every year. You should have your blood pressure measured twice-once when you are at a hospital or clinic, and once when you are not at a hospital or clinic. Record the average of the two measurements. To check your blood pressure when you are not at a hospital or clinic, you can use: ? An automated blood pressure machine at a pharmacy. ? A home blood pressure monitor.  If you are between 23 years and 40 years old, ask your health care provider if you should take aspirin to prevent strokes.  Have regular diabetes screenings. This involves taking a blood sample to check your fasting blood sugar level. ? If you are at a normal weight and have a low risk for diabetes,  have this test once every three years after 38 years of age. ? If you are overweight and have a high risk for diabetes, consider being tested at a younger age or more often. Preventing infection Hepatitis B  If you have a higher risk for hepatitis B, you should be screened for this virus. You are considered at high risk for hepatitis B if: ? You were born in a country where hepatitis B is common. Ask your health care provider which countries are considered high risk. ? Your parents were born in a high-risk country, and you have not been immunized against hepatitis B (hepatitis B vaccine). ? You have HIV or AIDS. ? You use needles to inject street drugs. ? You live with someone who has hepatitis B. ? You have had sex with someone who has hepatitis B. ? You get hemodialysis treatment. ? You take certain medicines for conditions, including cancer, organ transplantation, and autoimmune conditions.  Hepatitis C  Blood testing is recommended for: ? Everyone born from [birth year] through 1966. ? Anyone with known risk factors for hepatitis C.  Sexually transmitted infections (STIs)  You should be screened for sexually transmitted infections (STIs) including gonorrhea and chlamydia if: ? You are sexually active and are younger than 38 years of age. ? You are older than 38 years of age and your health care provider tells you that you are at risk for this type of infection. ? Your sexual activity has changed since you were last screened and you are at an increased risk for chlamydia or gonorrhea. Ask your health care provider if you are at risk.  If you do not have HIV, but are at risk, it may be recommended that you take a prescription medicine daily to prevent HIV infection. This is called pre-exposure prophylaxis (PrEP). You are considered at risk if: ? You are sexually active and do not regularly use condoms or know the HIV status of your partner(s). ? You take drugs by injection. ? You are  sexually active with a partner who has HIV.  Talk with your health care provider about whether you are at high risk of being infected with HIV. If you choose to begin PrEP, you should first be tested for HIV. You should then be tested every 3 months for as long as you are taking PrEP. Pregnancy  If you are premenopausal and you may become pregnant, ask your health care provider about preconception counseling.  If you may become  pregnant, take 400 to 800 micrograms (mcg) of folic acid every day.  If you want to prevent pregnancy, talk to your health care provider about birth control (contraception). Osteoporosis and menopause  Osteoporosis is a disease in which the bones lose minerals and strength with aging. This can result in serious bone fractures. Your risk for osteoporosis can be identified using a bone density scan.  If you are 37 years of age or older, or if you are at risk for osteoporosis and fractures, ask your health care provider if you should be screened.  Ask your health care provider whether you should take a calcium or vitamin D supplement to lower your risk for osteoporosis.  Menopause may have certain physical symptoms and risks.  Hormone replacement therapy may reduce some of these symptoms and risks. Talk to your health care provider about whether hormone replacement therapy is right for you. Follow these instructions at home:  Schedule regular health, dental, and eye exams.  Stay current with your immunizations.  Do not use any tobacco products including cigarettes, chewing tobacco, or electronic cigarettes.  If you are pregnant, do not drink alcohol.  If you are breastfeeding, limit how much and how often you drink alcohol.  Limit alcohol intake to no more than 1 drink per day for nonpregnant women. One drink equals 12 ounces of beer, 5 ounces of wine, or 1 ounces of hard liquor.  Do not use street drugs.  Do not share needles.  Ask your health care  provider for help if you need support or information about quitting drugs.  Tell your health care provider if you often feel depressed.  Tell your health care provider if you have ever been abused or do not feel safe at home. This information is not intended to replace advice given to you by your health care provider. Make sure you discuss any questions you have with your health care provider. Document Released: 11/11/2010 Document Revised: 10/04/2015 Document Reviewed: 01/30/2015 Elsevier Interactive Patient Education  2018 [employer].  Carbohydrate Counting for Diabetes Mellitus, Adult Carbohydrate counting is a method for keeping track of how many carbohydrates you eat. Eating carbohydrates naturally increases the amount of sugar (glucose) in the blood. Counting how many carbohydrates you eat helps keep your blood glucose within normal limits, which helps you manage your diabetes (diabetes mellitus). It is important to know how many carbohydrates you can safely have in each meal. This is different for every person. A diet and nutrition specialist (registered dietitian) can help you make a meal plan and calculate how many carbohydrates you should have at each meal and snack. Carbohydrates are found in the following foods:  Grains, such as breads and cereals.  Dried beans and soy products.  Starchy vegetables, such as potatoes, peas, and corn.  Fruit and fruit juices.  Milk and yogurt.  Sweets and snack foods, such as cake, cookies, candy, chips, and soft drinks.  How do I count carbohydrates? There are two ways to count carbohydrates in food. You can use either of the methods or a combination of both. Reading "Nutrition Facts" on packaged food The "Nutrition Facts" list is included on the labels of almost all packaged foods and beverages in the [place] It includes:  The serving size.  Information about nutrients in each serving, including the grams (g) of carbohydrate per  serving.  To use the "Nutrition Facts":  Decide how many servings you will have.  Multiply the number of servings by the number of  carbohydrates per serving.  The resulting number is the total amount of carbohydrates that you will be having.  Learning standard serving sizes of other foods When you eat foods containing carbohydrates that are not packaged or do not include "Nutrition Facts" on the label, you need to measure the servings in order to count the amount of carbohydrates:  Measure the foods that you will eat with a food scale or measuring cup, if needed.  Decide how many standard-size servings you will eat.  Multiply the number of servings by 15. Most carbohydrate-rich foods have about 15 g of carbohydrates per serving. ? For example, if you eat 8 oz (170 g) of strawberries, you will have eaten 2 servings and 30 g of carbohydrates (2 servings x 15 g = 30 g).  For foods that have more than one food mixed, such as soups and casseroles, you must count the carbohydrates in each food that is included.  The following list contains standard serving sizes of common carbohydrate-rich foods. Each of these servings has about 15 g of carbohydrates:   hamburger bun or  English muffin.   oz (15 mL) syrup.   oz (14 g) jelly.  1 slice of bread.  1 six-inch tortilla.  3 oz (85 g) cooked rice or pasta.  4 oz (113 g) cooked dried beans.  4 oz (113 g) starchy vegetable, such as peas, corn, or potatoes.  4 oz (113 g) hot cereal.  4 oz (113 g) mashed potatoes or  of a large baked potato.  4 oz (113 g) canned or frozen fruit.  4 oz (120 mL) fruit juice.  4-6 crackers.  6 chicken nuggets.  6 oz (170 g) unsweetened dry cereal.  6 oz (170 g) plain fat-free yogurt or yogurt sweetened with artificial sweeteners.  8 oz (240 mL) milk.  8 oz (170 g) fresh fruit or one small piece of fruit.  24 oz (680 g) popped popcorn.  Example of carbohydrate counting Sample meal  3  oz (85 g) chicken breast.  6 oz (170 g) brown rice.  4 oz (113 g) corn.  8 oz (240 mL) milk.  8 oz (170 g) strawberries with sugar-free whipped topping. Carbohydrate calculation 1. Identify the foods that contain carbohydrates: ? Rice. ? Corn. ? Milk. ? Strawberries. 2. Calculate how many servings you have of each food: ? 2 servings rice. ? 1 serving corn. ? 1 serving milk. ? 1 serving strawberries. 3. Multiply each number of servings by 15 g: ? 2 servings rice x 15 g = 30 g. ? 1 serving corn x 15 g = 15 g. ? 1 serving milk x 15 g = 15 g. ? 1 serving strawberries x 15 g = 15 g. 4. Add together all of the amounts to find the total grams of carbohydrates eaten: ? 30 g + 15 g + 15 g + 15 g = 75 g of carbohydrates total. This information is not intended to replace advice given to you by your health care provider. Make sure you discuss any questions you have with your health care provider. Document Released: 04/28/2005 Document Revised: 11/16/2015 Document Reviewed: 10/10/2015 Elsevier Interactive Patient Education  [employer].

## 2018-03-20 ENCOUNTER — Emergency Department (HOSPITAL_COMMUNITY)
Admission: EM | Admit: 2018-03-20 | Discharge: 2018-03-20 | Disposition: A | Discharge status: Home / Self Care | Payer: Self-pay | Attending: Emergency Medicine | Admitting: Emergency Medicine

## 2018-03-20 ENCOUNTER — Other Ambulatory Visit: Payer: Self-pay

## 2018-03-20 ENCOUNTER — Encounter (HOSPITAL_COMMUNITY): Payer: Self-pay | Admitting: Emergency Medicine

## 2018-03-20 DIAGNOSIS — Z79899 Other long term (current) drug therapy: Secondary | ICD-10-CM | POA: Insufficient documentation

## 2018-03-20 DIAGNOSIS — K0889 Other specified disorders of teeth and supporting structures: Secondary | ICD-10-CM

## 2018-03-20 DIAGNOSIS — L03211 Cellulitis of face: Secondary | ICD-10-CM

## 2018-03-20 LAB — POC URINE PREG, ED: Preg Test, Ur: NEGATIVE

## 2018-03-20 MED ORDER — FLUCONAZOLE 150 MG PO TABS: ORAL_TABLET | ORAL | 0 refills | Status: DC

## 2018-03-20 MED ORDER — CHLORHEXIDINE GLUCONATE 0.12 % MT SOLN: 15.0000 mL | Freq: Two times a day (BID) | OROMUCOSAL | 0 refills | Status: DC

## 2018-03-20 MED ORDER — LIDOCAINE VISCOUS HCL 2 % MT SOLN: 15.0000 mL | OROMUCOSAL | 0 refills | Status: DC | PRN

## 2018-03-20 MED ORDER — CLINDAMYCIN HCL 150 MG PO CAPS
450.0000 mg | ORAL_CAPSULE | Freq: Once | ORAL | Status: AC
  Administered 2018-03-20: 450 mg via ORAL
  Filled 2018-03-20: qty 3

## 2018-03-20 MED ORDER — CLINDAMYCIN HCL 150 MG PO CAPS: 450.0000 mg | ORAL_CAPSULE | Freq: Three times a day (TID) | ORAL | 0 refills | Status: AC

## 2018-03-20 MED ORDER — BUPIVACAINE-EPINEPHRINE (PF) 0.5% -1:200000 IJ SOLN
1.8000 mL | Freq: Once | INTRAMUSCULAR | Status: AC
  Administered 2018-03-20: 1.8 mL
  Filled 2018-03-20: qty 1.8

## 2018-03-20 NOTE — ED Notes (Signed)
Pt given water to drink with clindamycin.

## 2018-03-20 NOTE — ED Provider Notes (Signed)
[facility] EMERGENCY DEPARTMENT Provider Note   CSN: [id number] Arrival date & time: 03/20/18  8676     History   Chief Complaint Chief Complaint  Patient presents with  . Dental Pain  . Facial Swelling    HPI Nina Hill is a 37 y.o. female.  HPI   Nina Hill is a 37 y.o. female who presents to the Emergency Department complaining of persistent, gradually worsening, right-sided, upper dental pain beginning one week ago.  Patient reports that over the past 24 hours, she has had lateral swelling to the painful tooth.  She reports that her right cheek is edematous.  Patient denies any drainage from the tooth.  She reports the pain is proximal a 7 out of 10 today.  She reports was worse yesterday but improved spontaneously today.  Pt describes their pain as  throbbing.  No remedies tried for symptoms.  Patient reports that she has been waiting to get into her dentist.  Pain is exacerbated by chewing. They are currently followed by dentistry.  Pt denies fever, chills, difficulty breathing, difficulty swallowing, difficulty breathing, submandibular tenderness, neck pain, or changes in phonation.   Past Medical History:  Diagnosis Date  . Chlamydia infection 05/2010  . GC (gonococcus infection) 05/2010  . Pneumonia 04/2012  . SAB (spontaneous abortion) 05/2001    Patient Active Problem List   Diagnosis Date Noted  . 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 IRON DEFICIENCY 12/26/2009  . MIGRAINE, CHRONIC 12/26/2009    Past Surgical History:  Procedure Laterality Date  . IUD REMOVAL  06/30/2014   due to malposition, cramping and bleeding     OB History    Gravida  4   Para  0   Term      Preterm      AB  4   Living  0     SAB  2   TAB  2   Ectopic      Multiple      Live Births               Home Medications    Prior to Admission medications   Medication Sig Start Date End Date Taking? Authorizing Provider  metroNIDAZOLE  (FLAGYL) 500 MG tablet Take 1 tablet (500 mg total) by mouth 2 (two) times daily. Patient not taking: Reported on 06/17/2017 01/19/17   Huel Cote, NP  metroNIDAZOLE (METROGEL VAGINAL) 0.75 % vaginal gel 1 applicator per vagina at HS x 5 06/17/17   Huel Cote, NP  phenazopyridine (PYRIDIUM) 200 MG tablet Take 1 tablet (200 mg total) by mouth 3 (three) times daily as needed for pain. Patient not taking: Reported on 06/17/2017 01/19/17   Huel Cote, NP  topiramate (TOPAMAX) 100 MG tablet Take 100 mg by mouth 2 (two) times daily. 02/13/15   [provider]    Family History Family History  Problem Relation Age of Onset  . Hypertension Mother   . Hypertension Father   . Hypertension Sister   . Migraines Sister   . Asthma Brother   . Diabetes Paternal Grandmother     Social History Social History   Tobacco Use  . Smoking status: Never Smoker  . Smokeless tobacco: Never Used  Substance Use Topics  . Alcohol use: Yes    Comment: not often  . Drug use: No     Allergies   Penicillins   Review of Systems Review of Systems  Constitutional: Negative for  chills and fever.  HENT: Positive for dental problem. Negative for congestion, rhinorrhea, sore throat, trouble swallowing and voice change.   Respiratory: Negative for cough and stridor.   Gastrointestinal: Negative for nausea and vomiting.     Physical Exam Updated Vital Signs BP (!) 135/95 (BP Location: Right Arm)   Pulse 100   Temp 98.7 F (37.1 C) (Oral)   Resp 16   Ht 5\' 3"  (1.6 m)   Wt 81.6 kg   LMP 02/24/2018   SpO2 100%   BMI 31.89 kg/m   Physical Exam  Constitutional: She appears well-developed and well-nourished. No distress.  Sitting comfortably in bed.  HENT:  Head: Normocephalic and atraumatic.  Mouth/Throat:    Dental cavities and poor oral dentition noted. Pain along tooth as depicted in image. No abscess noted. Midline uvula. No trismus. OP clear and moist. No oropharyngeal erythema  or edema. Neck supple with no tenderness. Right sided facial edema without focal area of fluctuance. Right TM without erythema or edema. No EAC swelling.  Eyes: Conjunctivae are normal. Right eye exhibits no discharge. Left eye exhibits no discharge.  EOMs normal to gross examination.  Neck: Normal range of motion.  Cardiovascular: Normal rate, regular rhythm and normal heart sounds.  No murmur heard. Intact, 2+ radial pulse.  Pulmonary/Chest: Effort normal and breath sounds normal. No stridor. She has no wheezes. She has no rales.  Normal respiratory effort. Patient converses comfortably. No audible wheeze or stridor.  Abdominal: She exhibits no distension.  Musculoskeletal: Normal range of motion.  Neurological: She is alert.  Cranial nerves intact to gross observation. Patient moves extremities without difficulty.  Skin: Skin is warm and dry. She is not diaphoretic.  Psychiatric: She has a normal mood and affect. Her behavior is normal. Judgment and thought content normal.  Nursing note and vitals reviewed.    ED Treatments / Results  Labs (all labs ordered are listed, but only abnormal results are displayed) Labs Reviewed  POC URINE PREG, ED    EKG None  Radiology No results found.  Procedures Dental Block Date/Time: 03/20/2018 10:17 AM Performed by: Albesa Seen, PA-C Authorized by: Albesa Seen, PA-C   Consent:    Consent obtained:  Verbal   Consent given by:  Patient   Risks discussed:  Pain and unsuccessful block   Alternatives discussed:  No treatment Indications:    Indications: dental pain   Location:    Block type:  Supraperiosteal   Supraperiosteal location:  Upper teeth   Upper teeth location:  4/RU 2nd bicuspid Procedure details (see MAR for exact dosages):    Topical anesthetic:  Benzocaine gel   Syringe type:  Aspirating dental syringe   Needle gauge:  25 G   Anesthetic injected:  Bupivacaine 0.25% WITH epi   Injection procedure:   Anatomic landmarks identified, introduced needle, incremental injection, anatomic landmarks palpated and negative aspiration for blood Post-procedure details:    Outcome:  Anesthesia achieved   Patient tolerance of procedure:  Tolerated well, no immediate complications   (including critical care time)  Medications Ordered in ED Medications  bupivacaine-epinephrine (MARCAINE W/ EPI) 0.5% -1:200000 injection 1.8 mL (has no administration in time range)     Initial Impression / Assessment and Plan / ED Course  I have reviewed the triage vital signs and the nursing notes.  Pertinent labs & imaging results that were available during my care of the patient were reviewed by me and considered in my medical decision making (see  chart for details).  Clinical Course as of Mar 20 1012  Sat Mar 20, 2018  0955 Pulse now 80.   [AM]    Clinical Course User Index [AM] Albesa Seen, PA-C    Nina Hill is a 37 y.o. female who presents to ED for dental pain. No abscess requiring immediate incision and drainage. Patient is afebrile, non toxic appearing, and swallowing secretions well. Exam not concerning for Ludwig's angina or pharyngeal abscess.  Patient does have lateral swelling of the face next to the tooth that is involved, consistent with facial cellulitis.  Will treat with Clindamycin, chlorhexidine, and viscous lidocaine.  Patient also notes history of yeast vaginitis after antibiotics.  Diflucan prescribed.  Patient has plans to follow-up with her dentist.  Return precautions are given for any difficulty breathing or difficulty swallowing, neck induration, so antibiotic tenderness, or fever chills.  Patient voices understanding and is agreeable to plan.  Final Clinical Impressions(s) / ED Diagnoses   Final diagnoses:  Pain, dental  Facial cellulitis    ED Discharge Orders         Ordered    clindamycin (CLEOCIN) 150 MG capsule  3 times daily     03/20/18 1003    lidocaine  (XYLOCAINE) 2 % solution  As needed     03/20/18 1003    chlorhexidine (PERIDEX) 0.12 % solution  2 times daily     03/20/18 1003    fluconazole (DIFLUCAN) 150 MG tablet     03/20/18 1007           Albesa Seen, PA-C 03/20/18 1017    Sherwood Gambler, MD 03/21/18 0701

## 2018-03-20 NOTE — ED Triage Notes (Signed)
Pt. Stated, Nina Hill got a bad tooth upper right and now my face is swollen

## 2018-03-20 NOTE — ED Notes (Signed)
Pt verbalized understanding of d/c instructions, medications, and to follow up with dentist, no further questions. VSS, NAD

## 2018-03-20 NOTE — Discharge Instructions (Signed)
Please see the information and instructions below regarding your visit.  Your diagnoses today include:  1. Pain, dental   2. Facial cellulitis    You have a dental infection. It is very important that you get evaluated by a dentist as soon as possible. Call tomorrow to schedule an appointment. Ibuprofen/Tylenol as needed for pain. Take your full course of antibiotics. Read the instructions below.  Tests performed today include: See side panel of your discharge paperwork for testing performed today. Vital signs are listed at the bottom of these instructions.   Pregnancy negative.  Medications prescribed:    Take any prescribed medications only as prescribed, and any over the counter medications only as directed on the packaging.  1. You are prescribed Clindamycin, an antibiotic. Please take all of your antibiotics until finished.   You may develop abdominal discomfort or nausea from the antibiotic. If this occurs, you may take it with food. Some patients also get diarrhea with antibiotics. You may help offset this with probiotics which you can buy or get in yogurt. Do not eat or take the probiotics until 2 hours after your antibiotic. Some women develop vaginal yeast infections after antibiotics. If you develop unusual vaginal discharge after being on this medication, please see your primary care provider.   Some people develop allergies to antibiotics. Symptoms of antibiotic allergy can be mild and include a flat rash and itching. They can also be more serious and include:  ?Hives - Hives are raised, red patches of skin that are usually very itchy.  ?Lip or tongue swelling  ?Trouble swallowing or breathing  ?Blistering of the skin or mouth.  If you have any of these serious symptoms, please seek emergency medical care immediately.  2. I recommend ibuprofen, a non-steroidal anti-inflammatory agent (NSAID) for pain. You may take 600 mg every 6 hours as needed for pain. If still requiring  this medication around the clock for acute pain after 10 days, please see your primary healthcare provider.  You may combine this medication with Tylenol, 650 mg every 6 hours, so you are receiving something for pain every 3 hours. Do not exceed 4000 mg in one day.  This is not a long-term medication unless under the care and direction of your primary provider. Taking this medication long-term and not under the supervision of a healthcare provider could increase the risk of stomach ulcers, kidney problems, and cardiovascular problems such as high blood pressure.   3. You are prescribed viscous lidocaine swish and spit every 6 hours as needed for tooth and gum discomfort. Do not swallow.   4. You are prescribed chlorhexidine solution swish and spit twice daily. Do not swallow. This is to clear bacteria from your mouth.   5.  You may take Diflucan 1 capsule the day after he finished antibiotics, and then repeat the dose 48 hours later.  Home care instructions:  Please follow any educational materials contained in this packet.   Eat a soft or liquid diet and rinse your mouth out after meals with warm water. You should see a dentist or return here at once if you have increased swelling, increased pain or uncontrolled bleeding from the site of your injury.  Follow-up instructions: It is very important that you see a dentist as soon as possible. There is a list of dentists attached to this packet if you do not have care established with a dentist already. Please give a call to a dentist of your choice tomorrow.  Return  instructions:  Please return to the Emergency Department if you experience worsening symptoms.  Please seek care if you note any of the following about your dental pain:  You have increased pain not controlled with medicines.  You have swelling around your tooth, in your face or neck.  You have bleeding which starts, continues, or gets worse.  You have a fever >101 If you are  unable to open your mouth Please return if you have any other emergent concerns.  Additional Information:   Your vital signs today were: BP (!) 135/95 (BP Location: Right Arm)    Pulse 100    Temp 98.7 F (37.1 C) (Oral)    Resp 16    Ht 5\' 3"  (1.6 m)    Wt 81.6 kg    LMP 02/24/2018    SpO2 100%    BMI 31.89 kg/m  If your blood pressure (BP) was elevated on multiple readings during this visit above 130 for the top number or above 80 for the bottom number, please have this repeated by your primary care provider within one month. --------------  Thank you for allowing [place] to participate in your care today.

## 2018-06-16 ENCOUNTER — Encounter: Payer: Self-pay | Admitting: Obstetrics and Gynecology

## 2018-06-23 ENCOUNTER — Encounter: Payer: BC Managed Care – PPO | Admitting: Women's Health

## 2018-06-28 ENCOUNTER — Encounter: Payer: Self-pay | Admitting: Obstetrics & Gynecology

## 2018-07-13 ENCOUNTER — Telehealth: Payer: Self-pay | Admitting: *Deleted

## 2018-07-13 MED ORDER — FLUCONAZOLE 150 MG PO TABS: 150.0000 mg | ORAL_TABLET | Freq: Once | ORAL | 0 refills | Status: AC

## 2018-07-13 NOTE — Telephone Encounter (Signed)
Rx sent, received voicemail told her to call and schedule ASAP.

## 2018-07-13 NOTE — Telephone Encounter (Signed)
Patient called c/o vaginal irritation, itching asked if diflucan tablet can be sent to pharmacy. She is overdue for annual exam, I did explained she needs to schedule. Patient states she will, but has yet to do so. Please advise

## 2018-07-13 NOTE — Telephone Encounter (Signed)
Okay for Diflucan 150 mg if she schedules annual exam.

## 2018-07-21 ENCOUNTER — Encounter: Payer: Self-pay | Admitting: Obstetrics and Gynecology

## 2018-08-17 ENCOUNTER — Encounter: Payer: Self-pay | Admitting: Advanced Practice Midwife

## 2018-10-12 ENCOUNTER — Other Ambulatory Visit: Payer: Self-pay

## 2018-10-13 ENCOUNTER — Ambulatory Visit: Payer: BC Managed Care – PPO | Admitting: Women's Health

## 2018-10-13 ENCOUNTER — Encounter: Payer: Self-pay | Admitting: Women's Health

## 2018-10-13 VITALS — BP 130/82 | Ht 64.0 in | Wt 185.0 lb

## 2018-10-13 DIAGNOSIS — Z01419 Encounter for gynecological examination (general) (routine) without abnormal findings: Secondary | ICD-10-CM

## 2018-10-13 DIAGNOSIS — N898 Other specified noninflammatory disorders of vagina: Secondary | ICD-10-CM | POA: Diagnosis not present

## 2018-10-13 DIAGNOSIS — N926 Irregular menstruation, unspecified: Secondary | ICD-10-CM

## 2018-10-13 LAB — WET PREP FOR TRICH, YEAST, CLUE

## 2018-10-13 MED ORDER — METRONIDAZOLE 500 MG PO TABS: 500.0000 mg | ORAL_TABLET | Freq: Two times a day (BID) | ORAL | 0 refills | Status: DC

## 2018-10-13 NOTE — Progress Notes (Signed)
Nina Hill 09/30/1980 [id number]    History:    Presents for annual exam.  Monthly cycle getting more irregular.  Using no contraception for greater than 18 months without pregnancy desires conception.  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  Normal Pap history.  History of migraines without aura on Topamax per neurologist.  Past medical history, past surgical history, family history and social history were all reviewed and documented in the EPIC chart.  First grade teacher.  Parents and sister hypertension.  ROS:  A ROS was performed and pertinent positives and negatives are included.  Exam:  Vitals:   10/13/18 1515  BP: 130/82  Weight: 185 lb (83.9 kg)  Height: 5\' 4"  (1.626 m)   Body mass index is 31.76 kg/m.   General appearance:  Normal Thyroid:  Symmetrical, normal in size, without palpable masses or nodularity. Respiratory  Auscultation:  Clear without wheezing or rhonchi Cardiovascular  Auscultation:  Regular rate, without rubs, murmurs or gallops  Edema/varicosities:  Not grossly evident Abdominal  Soft,nontender, without masses, guarding or rebound.  Liver/spleen:  No organomegaly noted  Hernia:  None appreciated  Skin  Inspection:  Grossly normal   Breasts: Examined lying and sitting.     Right: Without masses, retractions, discharge or axillary adenopathy.     Left: Without masses, retractions, discharge or axillary adenopathy. Gentitourinary   Inguinal/mons:  Normal without inguinal adenopathy  External genitalia:  Normal  BUS/Urethra/Skene's glands:  Normal  Vagina: Menses wet prep positive for clues, TNTC bacteria  Cervix:  Normal  Uterus:   normal in size, shape and contour.  Midline and mobile  Adnexa/parametria:     Rt: Without masses or tenderness.   Lt: Without masses or tenderness.  Anus and perineum: Normal  Digital rectal exam: Normal sphincter tone without palpated masses or tenderness  Assessment/Plan:   38 y.o. SBF G4 P0 for annual exam desiring conception.  Monthly cycle becoming more irregular Bacterial vaginosis Obesity Migraines without aura on Topamax  Plan: On day 4 of cycle, we will check a estradiol , FSH, TSH, prolactin, CBC.  We will schedule follow-up with fertility management, reviewed need for semen analysis.  SBEs, exercise, calcium rich foods, decrease calories/carbs, prenatal vitamin daily encouraged.  Safe pregnancy behaviors reviewed.  Continue frequent 3-4 times weekly intercourse.  Flagyl 500 twice daily for 7 days, alcohol precautions reviewed.  Pap with HR HPV typing, new screening guidelines reviewed.    [place], 5:24 PM 10/13/2018

## 2018-10-13 NOTE — Patient Instructions (Signed)

## 2018-10-14 ENCOUNTER — Telehealth: Payer: Self-pay | Admitting: *Deleted

## 2018-10-14 LAB — CBC WITH DIFFERENTIAL/PLATELET
Absolute Monocytes: 400 cells/uL (ref 200–950)
Basophils Absolute: 83 cells/uL (ref 0–200)
Basophils Relative: 1.2 %
Eosinophils Absolute: 262 cells/uL (ref 15–500)
Eosinophils Relative: 3.8 %
HCT: 35.4 % (ref 35.0–45.0)
Hemoglobin: 12.1 g/dL (ref 11.7–15.5)
Lymphs Abs: 3491 cells/uL (ref 850–3900)
MCH: 33.2 pg — ABNORMAL HIGH (ref 27.0–33.0)
MCHC: 34.2 g/dL (ref 32.0–36.0)
MCV: 97.3 fL (ref 80.0–100.0)
MPV: 10.3 fL (ref 7.5–12.5)
Monocytes Relative: 5.8 %
Neutro Abs: 2663 cells/uL (ref 1500–7800)
Neutrophils Relative %: 38.6 %
Platelets: 279 10*3/uL (ref 140–400)
RBC: 3.64 10*6/uL — ABNORMAL LOW (ref 3.80–5.10)
RDW: 12.8 % (ref 11.0–15.0)
Total Lymphocyte: 50.6 %
WBC: 6.9 10*3/uL (ref 3.8–10.8)

## 2018-10-14 LAB — FOLLICLE STIMULATING HORMONE: FSH: 8.3 m[IU]/mL

## 2018-10-14 LAB — ESTRADIOL: Estradiol: 50 pg/mL

## 2018-10-14 LAB — TSH: TSH: 1.74 mIU/L

## 2018-10-14 LAB — PROLACTIN: Prolactin: 8.8 ng/mL

## 2018-10-14 NOTE — Telephone Encounter (Signed)
Office notes faxed to Dr. Kerin Perna office they will call to schedule.

## 2018-10-14 NOTE — Telephone Encounter (Signed)
-----   Message from Huel Cote, NP sent at 10/13/2018  3:36 PM EDT ----- Needs appt with fertiltiy management , no contraception for 18 mo without conception.  Anytime ok.  Dr Charlett Lango office.

## 2018-10-15 LAB — PAP, TP IMAGING W/ HPV RNA, RFLX HPV TYPE 16,18/45: HPV DNA High Risk: NOT DETECTED

## 2018-10-28 ENCOUNTER — Telehealth: Payer: Self-pay

## 2018-10-28 NOTE — Telephone Encounter (Signed)
Spoke with pt to reschedule appt that was cancelled due to corona. Pt wants to call back to schedule at a later time.

## 2018-11-01 ENCOUNTER — Telehealth: Payer: Self-pay | Admitting: *Deleted

## 2018-11-01 ENCOUNTER — Other Ambulatory Visit: Payer: Self-pay | Admitting: Women's Health

## 2018-11-01 MED ORDER — FLUCONAZOLE 150 MG PO TABS: 150.0000 mg | ORAL_TABLET | Freq: Once | ORAL | 0 refills | Status: AC

## 2018-11-01 NOTE — Telephone Encounter (Signed)
Rx sent 

## 2018-11-01 NOTE — Telephone Encounter (Signed)
Patient called c/o vaginal itching and irritation only, was treated for BV on 10/13/18. No vaginal odor or discharge. Please advise

## 2018-11-01 NOTE — Telephone Encounter (Signed)
Please call in Diflucan 150 mg x 1 dose.  Hopefully that will help.

## 2018-12-21 ENCOUNTER — Telehealth: Payer: Self-pay | Admitting: *Deleted

## 2018-12-21 NOTE — Telephone Encounter (Signed)
Left detailed message on cell.

## 2018-12-21 NOTE — Telephone Encounter (Signed)
Patient called c/o vaginal itching asked if diflucan tablet can be sent to pharmacy, reports using a new fabric softer, only c/o itching , no discharge. OTC does not help, diflucan was sent on 11/01/18 as well. Aware OV best.  Please advise

## 2018-12-21 NOTE — Telephone Encounter (Signed)
Please call and review office visit best since we just sent in Diflucan end of June.

## 2019-01-11 ENCOUNTER — Ambulatory Visit: Payer: BC Managed Care – PPO | Admitting: Women's Health

## 2019-02-15 ENCOUNTER — Other Ambulatory Visit: Payer: Self-pay

## 2019-02-16 ENCOUNTER — Encounter: Payer: Self-pay | Admitting: Women's Health

## 2019-02-16 ENCOUNTER — Ambulatory Visit: Payer: BC Managed Care – PPO | Admitting: Women's Health

## 2019-02-16 VITALS — BP 118/62

## 2019-02-16 DIAGNOSIS — N898 Other specified noninflammatory disorders of vagina: Secondary | ICD-10-CM

## 2019-02-16 LAB — WET PREP FOR TRICH, YEAST, CLUE

## 2019-02-16 MED ORDER — FLUCONAZOLE 150 MG PO TABS: ORAL_TABLET | ORAL | 0 refills | Status: DC

## 2019-02-16 NOTE — Progress Notes (Signed)
38 yo SBF G4P0 presents with complaint of vaginal itching, irritation with minimal relief with over-the-counter Monistat.  States feels needs something stronger.  Reports occasional odor but itching is more of the symptom.  Denies urinary symptoms, abdominal/back pain or fever.  History of BV and yeast in the past.  Same partner negative STD screen.  Monthly cycle desiring pregnancy.  Has seen [facility] has done 2 rounds of Clomid with no pregnancy.  Partner has not had a semen analysis.  First grade teacher.  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  Exam: Appears well.  No CVAT.  Abdomen soft, nontender, external genitalia within normal limits, erythemic at introitus, speculum exam moderate amount of a curdy type discharge, vaginal walls erythematous, wet prep positive for yeast.  Bimanual no CMT or adnexal tenderness.  Yeast vaginitis  Plan: Diflucan 150 p.o. today repeat in 3 days.  Yeast prevention discussed.  Instructed to call if no relief.  Boric acid gelcap suppression therapy reviewed.  Encouraged semen analysis and return to [facility].

## 2019-02-16 NOTE — Patient Instructions (Signed)
Boric acid gel caps, 1 per vagina twice weekly, get on amaz[employer] 30 for $10.    Vaginal Yeast infection, Adult  Vaginal yeast infection is a condition that causes vaginal discharge as well as soreness, swelling, and redness (inflammation) of the vagina. This is a common condition. Some women get this infection frequently. What are the causes? This condition is caused by a change in the normal balance of the yeast (candida) and bacteria that live in the vagina. This change causes an overgrowth of yeast, which causes the inflammation. What increases the risk? The condition is more likely to develop in women who:  Take antibiotic medicines.  Have diabetes.  Take birth control pills.  Are pregnant.  Douche often.  Have a weak body defense system (immune system).  Have been taking steroid medicines for a long time.  Frequently wear tight clothing. What are the signs or symptoms? Symptoms of this condition include:  White, thick, creamy vaginal discharge.  Swelling, itching, redness, and irritation of the vagina. The lips of the vagina (vulva) may be affected as well.  Pain or a burning feeling while urinating.  Pain during sex. How is this diagnosed? This condition is diagnosed based on:  Your medical history.  A physical exam.  A pelvic exam. Your health care provider will examine a sample of your vaginal discharge under a microscope. Your health care provider may send this sample for testing to confirm the diagnosis. How is this treated? This condition is treated with medicine. Medicines may be over-the-counter or prescription. You may be told to use one or more of the following:  Medicine that is taken by mouth (orally).  Medicine that is applied as a cream (topically).  Medicine that is inserted directly into the vagina (suppository). Follow these instructions at home:  Lifestyle  Do not have sex until your health care provider approves. Tell your sex partner that  you have a yeast infection. That person should go to his or her health care provider and ask if they should also be treated.  Do not wear tight clothes, such as pantyhose or tight pants.  Wear breathable cotton underwear. General instructions  Take or apply over-the-counter and prescription medicines only as told by your health care provider.  Eat more yogurt. This may help to keep your yeast infection from returning.  Do not use tampons until your health care provider approves.  Try taking a sitz bath to help with discomfort. This is a warm water bath that is taken while you are sitting down. The water should only come up to your hips and should cover your buttocks. Do this 3-4 times per day or as told by your health care provider.  Do not douche.  If you have diabetes, keep your blood sugar levels under control.  Keep all follow-up visits as told by your health care provider. This is important. Contact a health care provider if:  You have a fever.  Your symptoms go away and then return.  Your symptoms do not get better with treatment.  Your symptoms get worse.  You have new symptoms.  You develop blisters in or around your vagina.  You have blood coming from your vagina and it is not your menstrual period.  You develop pain in your abdomen. Summary  Vaginal yeast infection is a condition that causes discharge as well as soreness, swelling, and redness (inflammation) of the vagina.  This condition is treated with medicine. Medicines may be over-the-counter or prescription.  Take  or apply over-the-counter and prescription medicines only as told by your health care provider.  Do not douche. Do not have sex or use tampons until your health care provider approves.  Contact a health care provider if your symptoms do not get better with treatment or your symptoms go away and then return. This information is not intended to replace advice given to you by your health care  provider. Make sure you discuss any questions you have with your health care provider. Document Released: 02/05/2005 Document Revised: 09/14/2017 Document Reviewed: 09/14/2017 Elsevier Patient Education  [place].

## 2019-05-20 ENCOUNTER — Other Ambulatory Visit: Payer: Self-pay | Admitting: Women's Health

## 2019-05-20 DIAGNOSIS — Z1231 Encounter for screening mammogram for malignant neoplasm of breast: Secondary | ICD-10-CM

## 2019-06-10 ENCOUNTER — Telehealth: Payer: Self-pay | Admitting: *Deleted

## 2019-06-10 NOTE — Telephone Encounter (Signed)
Returned call from 06/09/19 at 4:01 PM. Left patient a message to call or [facility] message to reschedule annual from cancelled appointments in the past.

## 2019-07-06 ENCOUNTER — Ambulatory Visit: Payer: Self-pay

## 2019-07-16 ENCOUNTER — Ambulatory Visit: Payer: BC Managed Care – PPO | Attending: Internal Medicine

## 2019-07-16 DIAGNOSIS — Z23 Encounter for immunization: Secondary | ICD-10-CM

## 2019-07-16 NOTE — Progress Notes (Signed)
   Covid-19 Vaccination Clinic  Name:  [name]    MRN: [id number] DOB: [birth year]  07/16/2019  Ms. Nina Hill was observed post Covid-19 immunization for 15 minutes without incident. She was provided with Vaccine Information Sheet and instruction to access the V-Safe system.   Ms. Nina Hill was instructed to call 911 with any severe reactions post vaccine: Marland Kitchen Difficulty breathing  . Swelling of face and throat  . A fast heartbeat  . A bad rash all over body  . Dizziness and weakness   Immunizations Administered    Name Date Dose VIS Date Route   Pfizer COVID-19 Vaccine 07/16/2019  9:04 AM 0.3 mL 04/22/2019 Intramuscular   Manufacturer: [place]   Lot: [id number]   [place]: [id number]

## 2019-08-03 ENCOUNTER — Ambulatory Visit: Payer: Self-pay

## 2019-08-06 ENCOUNTER — Ambulatory Visit: Payer: BC Managed Care – PPO | Attending: Internal Medicine

## 2019-08-06 DIAGNOSIS — Z23 Encounter for immunization: Secondary | ICD-10-CM

## 2019-08-06 NOTE — Progress Notes (Signed)
   Covid-19 Vaccination Clinic  Name:  Nina Hill    MRN: [id number] DOB: [birth year]  08/06/2019  Ms. Nina Hill was observed post Covid-19 immunization for 15 minutes without incident. She was provided with Vaccine Information Sheet and instruction to access the V-Safe system.   Ms. Nina Hill was instructed to call 911 with any severe reactions post vaccine: Marland Kitchen Difficulty breathing  . Swelling of face and throat  . A fast heartbeat  . A bad rash all over body  . Dizziness and weakness   Immunizations Administered    Name Date Dose VIS Date Route   Pfizer COVID-19 Vaccine 08/06/2019 12:14 PM 0.3 mL 04/22/2019 Intramuscular   Manufacturer: [employer], [employer]   Lot: [id number]   [place]: [id number]

## 2019-08-15 ENCOUNTER — Ambulatory Visit: Payer: BC Managed Care – PPO

## 2019-08-17 ENCOUNTER — Ambulatory Visit: Payer: BC Managed Care – PPO

## 2019-10-28 ENCOUNTER — Encounter: Payer: BC Managed Care – PPO | Admitting: Obstetrics and Gynecology

## 2019-11-01 ENCOUNTER — Other Ambulatory Visit: Payer: Self-pay | Admitting: Family Medicine

## 2019-11-01 DIAGNOSIS — Z1231 Encounter for screening mammogram for malignant neoplasm of breast: Secondary | ICD-10-CM

## 2019-11-18 ENCOUNTER — Other Ambulatory Visit: Payer: Self-pay | Admitting: Certified Nurse Midwife

## 2019-11-18 ENCOUNTER — Encounter: Payer: Self-pay | Admitting: Certified Nurse Midwife

## 2019-11-18 ENCOUNTER — Other Ambulatory Visit: Payer: Self-pay

## 2019-11-18 ENCOUNTER — Ambulatory Visit (INDEPENDENT_AMBULATORY_CARE_PROVIDER_SITE_OTHER): Payer: BC Managed Care – PPO | Admitting: Certified Nurse Midwife

## 2019-11-18 ENCOUNTER — Other Ambulatory Visit (HOSPITAL_COMMUNITY)
Admission: RE | Admit: 2019-11-18 | Discharge: 2019-11-18 | Disposition: A | Discharge status: Home / Self Care | Payer: BC Managed Care – PPO | Source: Ambulatory Visit | Attending: Certified Nurse Midwife | Admitting: Certified Nurse Midwife

## 2019-11-18 ENCOUNTER — Ambulatory Visit: Admission: RE | Admit: 2019-11-18 | Payer: BC Managed Care – PPO | Source: Ambulatory Visit

## 2019-11-18 VITALS — BP 117/77 | HR 72 | Resp 16 | Ht 62.0 in | Wt 182.0 lb

## 2019-11-18 DIAGNOSIS — N63 Unspecified lump in unspecified breast: Secondary | ICD-10-CM

## 2019-11-18 DIAGNOSIS — Z01419 Encounter for gynecological examination (general) (routine) without abnormal findings: Secondary | ICD-10-CM

## 2019-11-18 DIAGNOSIS — Z3169 Encounter for other general counseling and advice on procreation: Secondary | ICD-10-CM

## 2019-11-18 NOTE — Progress Notes (Addendum)
Gynecology Annual Exam   History of Present Illness: Nina Hill is a 39 y.o. G21P0040 engaged female presenting for an annual exam. She is getting married next year. She desires conception. She has not used contraception for the last 3 years. She reports her fiance is a Administrator and not home a lot. She has seen [facility] for a consultation, unsure when that was, reports they had no recommendations since she was cycling regularly. She did become pregnant 20 years ago with a different partner. She has occasional dyspareunia with deep penetration. She does perform self breast exams inconcistently. There is no notable family history of breast or ovarian cancer in her family.   Past Medical History:  Past Medical History:  Diagnosis Date  . Chlamydia infection 05/2010  . GC (gonococcus infection) 05/2010  . Migraine   . Pneumonia 04/2012  . SAB (spontaneous abortion) 05/2001    Past Surgical History:  Past Surgical History:  Procedure Laterality Date  . IUD REMOVAL  06/30/2014   due to malposition, cramping and bleeding    Gynecologic History:  LMP: Patient's last menstrual period was 11/05/2019. Average Interval: regular Contraception: none Last Pap: completed on 06/17/13 ; result was: no abnormalities  Mammogram: none  Obstetric History: G4P0040  Family History:  Family History  Problem Relation Age of Onset  . Hypertension Mother   . Hypertension Father   . Hypertension Sister   . Migraines Sister   . Asthma Sister   . Asthma Brother   . Diabetes Paternal Grandmother     Social History:  Social History   Socioeconomic History  . Marital status: Single    Spouse name: Not on file  . Number of children: Not on file  . Years of education: Not on file  . Highest education level: Not on file  Occupational History  . Not on file  Tobacco Use  . Smoking status: Never Smoker  . Smokeless tobacco: Never Used  Vaping Use  . Vaping Use: Never used   Substance and Sexual Activity  . Alcohol use: Yes    Comment: not often  . Drug use: No  . Sexual activity: Yes    Birth control/protection: None  Other Topics Concern  . Not on file  Social History Narrative  . Not on file   Social Determinants of Health   Financial Resource Strain:   . Difficulty of Paying Living Expenses:   Food Insecurity:   . Worried About Charity fundraiser in the Last Year:   . Arboriculturist in the Last Year:   Transportation Needs:   . Film/video editor (Medical):   Marland Kitchen Lack of Transportation (Non-Medical):   Physical Activity:   . Days of Exercise per Week:   . Minutes of Exercise per Session:   Stress:   . Feeling of Stress :   Social Connections:   . Frequency of Communication with Friends and Family:   . Frequency of Social Gatherings with Friends and Family:   . Attends Religious Services:   . Active Member of Clubs or Organizations:   . Attends Archivist Meetings:   Marland Kitchen Marital Status:   Intimate Partner Violence:   . Fear of Current or Ex-Partner:   . Emotionally Abused:   Marland Kitchen Physically Abused:   . Sexually Abused:     Allergies:  Allergies  Allergen Reactions  . Penicillins     REACTION: SOB    Medications: Prior to Admission  medications   Medication Sig Start Date End Date Taking? Authorizing Provider  Multiple Vitamin (MULTIVITAMIN) tablet Take 1 tablet by mouth daily.   Yes [provider]  topiramate (TOPAMAX) 100 MG tablet Take 100 mg by mouth 2 (two) times daily. 02/13/15  Yes [provider]    Review of Systems: negative except noted in HPI  Physical Exam Vitals: BP 117/77   Pulse 72   Resp 16   Ht 5\' 2"  (1.575 m)   Wt 182 lb (82.6 kg)   LMP 11/05/2019   BMI 33.29 kg/m  General: NAD HEENT: normocephalic, atraumatic Thyroid: no enlargement, no palpable nodules Pulmonary: Normal rate and effort, CTAB Cardiovascular: RRR Breast: Right breast: no tenderness, no mass, no skin or  nipple retraction present, no nipple discharge. No axillary or supraclavicular lymphadenopathy. Left breast: no tenderness, no skin or nipple retraction present, no nipple discharge. No axillary or supraclavicular lymphadenopathy; 1 cm round, mobile mass palpated at 1 o'clock upper outer quadrant Abdomen: soft, non-tender, non-distended. No hepatomegaly, splenomegaly or masses palpable. No evidence of hernia  Genitourinary:  External: Normal external female genitalia. Normal urethral meatus  Vagina: Normal vaginal mucosa, no evidence of prolapse   Cervix: Grossly normal in appearance, no bleeding  Uterus: Non-enlarged, mobile, normal contour. No CMT  Adnexa: non-enlarged, no masses  Rectal: deferred Extremities: no edema, erythema, or tenderness Neurologic: Grossly intact Psychiatric: mood appropriate, affect full  Female chaperone present for pelvic and breast portions of the physical exam  Assessment:  1. Well woman exam   2. Breast mass   3. Encounter for preconception consultation     Plan: Was scheduled for screening mammogram this afternoon, diagnostic mammo ordered Pap today Recommend f/u with repro endo for infertility counseling and management d/t age Recommend ovulatory predictor kits and timed IC Start PNV daily Follow up with GYN in 1 year or prn Follow up with PCP as scheduled  Julianne Handler, CNM 11/18/2019 10:48 AM

## 2019-11-21 LAB — CYTOLOGY - PAP
Comment: NEGATIVE
Diagnosis: NEGATIVE
High risk HPV: NEGATIVE

## 2019-12-02 ENCOUNTER — Ambulatory Visit
Admission: RE | Admit: 2019-12-02 | Discharge: 2019-12-02 | Disposition: A | Discharge status: Home / Self Care | Payer: BC Managed Care – PPO | Source: Ambulatory Visit | Attending: Certified Nurse Midwife | Admitting: Certified Nurse Midwife

## 2019-12-02 ENCOUNTER — Other Ambulatory Visit: Payer: Self-pay

## 2019-12-02 ENCOUNTER — Other Ambulatory Visit: Payer: Self-pay | Admitting: Certified Nurse Midwife

## 2019-12-02 DIAGNOSIS — N63 Unspecified lump in unspecified breast: Secondary | ICD-10-CM

## 2019-12-02 DIAGNOSIS — N6320 Unspecified lump in the left breast, unspecified quadrant: Secondary | ICD-10-CM | POA: Insufficient documentation

## 2020-06-04 ENCOUNTER — Other Ambulatory Visit: Payer: BC Managed Care – PPO

## 2020-07-04 ENCOUNTER — Other Ambulatory Visit: Payer: BC Managed Care – PPO

## 2020-08-28 ENCOUNTER — Other Ambulatory Visit: Payer: Self-pay | Admitting: Certified Nurse Midwife

## 2020-08-28 ENCOUNTER — Ambulatory Visit
Admission: RE | Admit: 2020-08-28 | Discharge: 2020-08-28 | Disposition: A | Discharge status: Home / Self Care | Payer: BC Managed Care – PPO | Source: Ambulatory Visit | Attending: Certified Nurse Midwife | Admitting: Certified Nurse Midwife

## 2020-08-28 ENCOUNTER — Other Ambulatory Visit: Payer: Self-pay

## 2020-08-28 DIAGNOSIS — N6320 Unspecified lump in the left breast, unspecified quadrant: Secondary | ICD-10-CM

## 2020-08-28 DIAGNOSIS — N63 Unspecified lump in unspecified breast: Secondary | ICD-10-CM

## 2020-08-29 ENCOUNTER — Ambulatory Visit: Payer: BC Managed Care – PPO | Admitting: Physician Assistant

## 2020-12-03 ENCOUNTER — Other Ambulatory Visit: Payer: BC Managed Care – PPO

## 2020-12-10 ENCOUNTER — Ambulatory Visit: Payer: BC Managed Care – PPO | Admitting: Physician Assistant

## 2021-01-03 ENCOUNTER — Other Ambulatory Visit: Payer: Self-pay

## 2021-01-03 ENCOUNTER — Ambulatory Visit
Admission: RE | Admit: 2021-01-03 | Discharge: 2021-01-03 | Disposition: A | Discharge status: Home / Self Care | Payer: BC Managed Care – PPO | Source: Ambulatory Visit | Attending: Certified Nurse Midwife | Admitting: Certified Nurse Midwife

## 2021-01-03 DIAGNOSIS — N6320 Unspecified lump in the left breast, unspecified quadrant: Secondary | ICD-10-CM

## 2021-01-23 ENCOUNTER — Other Ambulatory Visit: Payer: Self-pay

## 2021-01-23 ENCOUNTER — Ambulatory Visit (INDEPENDENT_AMBULATORY_CARE_PROVIDER_SITE_OTHER): Payer: BC Managed Care – PPO | Admitting: Podiatry

## 2021-01-23 ENCOUNTER — Ambulatory Visit (INDEPENDENT_AMBULATORY_CARE_PROVIDER_SITE_OTHER): Payer: BC Managed Care – PPO

## 2021-01-23 DIAGNOSIS — M2141 Flat foot [pes planus] (acquired), right foot: Secondary | ICD-10-CM

## 2021-01-23 DIAGNOSIS — M216X2 Other acquired deformities of left foot: Secondary | ICD-10-CM

## 2021-01-23 DIAGNOSIS — M2142 Flat foot [pes planus] (acquired), left foot: Secondary | ICD-10-CM

## 2021-01-23 DIAGNOSIS — M216X1 Other acquired deformities of right foot: Secondary | ICD-10-CM | POA: Diagnosis not present

## 2021-01-23 NOTE — Progress Notes (Signed)
   Subjective:  40 y.o. female presenting today as a new patient for evaluation of pain and tenderness to the bilateral feet.  Patient states that she has had flatfeet for several years.  She has not done anything for treatment.  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  She presents for further treatment and evaluation   Past Medical History:  Diagnosis Date   Chlamydia infection 05/2010   GC (gonococcus infection) 05/2010   Migraine    Pneumonia 04/2012   SAB (spontaneous abortion) 05/2001       Objective/Physical Exam General: The patient is alert and oriented x3 in no acute distress.  Dermatology: Skin is warm, dry and supple bilateral lower extremities. Negative for open lesions or macerations.  Vascular: Palpable pedal pulses bilaterally. No edema or erythema noted. Capillary refill within normal limits.  Neurological: Epicritic and protective threshold grossly intact bilaterally.   Musculoskeletal Exam: Range of motion within normal limits to all pedal and ankle joints bilateral. Muscle strength 5/5 in all groups bilateral.  Upon weightbearing there is a medial longitudinal arch collapse bilaterally. Remove foot valgus noted to the bilateral lower extremities with excessive pronation upon mid stance. Limited ankle joint dorsiflexion also noted consistent with findings of any equinus bilateral  Radiographic Exam:  Normal osseous mineralization. Joint spaces preserved. No fracture/dislocation/boney destruction.   Pes planus noted on radiographic exam lateral views. Decreased calcaneal inclination and metatarsal declination angle is noted. Anterior break in the cyma line noted on lateral views. Medial talar head to deviation noted on AP radiograph.   Assessment: 1. pes planus bilateral 2.  Equinus deformity bilateral   Plan of Care:  1. Patient was evaluated. X-Rays reviewed.   2.  Today we discussed different treatment options associated to flatfoot deformity.  Today were going to mull the patient for custom molded orthotics 3.  Advised against going barefoot and always wearing shoes or sandals that support the arches of the feet 4.  Return to clinic for orthotics pickup  *Elementary school teacher in [facility]   Edrick Kins, [facility]  Dr. Edrick Kins, DPM    [address], [place] [address]                Office [phone]  Fax [phone]

## 2021-02-08 ENCOUNTER — Ambulatory Visit: Payer: BC Managed Care – PPO | Admitting: Physician Assistant

## 2021-02-26 ENCOUNTER — Telehealth: Payer: Self-pay | Admitting: Podiatry

## 2021-02-26 NOTE — Telephone Encounter (Signed)
Pt left message asking about her cost for the orthotics she has [place] plan.  I returned call and left message that I could not guarantee payment but normally [place] [place] pays 100% if billed with an office visit and pt pays the copay. The current charges for the orthotics are pending with the insurance company.

## 2021-03-01 ENCOUNTER — Encounter: Payer: Self-pay | Admitting: Podiatrist

## 2021-03-01 ENCOUNTER — Ambulatory Visit (INDEPENDENT_AMBULATORY_CARE_PROVIDER_SITE_OTHER): Payer: BC Managed Care – PPO | Admitting: Podiatrist

## 2021-03-01 ENCOUNTER — Other Ambulatory Visit: Payer: Self-pay

## 2021-03-01 DIAGNOSIS — M2141 Flat foot [pes planus] (acquired), right foot: Secondary | ICD-10-CM

## 2021-03-01 DIAGNOSIS — M2142 Flat foot [pes planus] (acquired), left foot: Secondary | ICD-10-CM

## 2021-03-01 NOTE — Progress Notes (Signed)
Patient picked up orthotics today-  she tried them in her nike shoes and was satisfied with the fit.  I advised her she may need to get a shoe 1/2 size larger and to take the orthotics with her when shoe shopping.  She will call if any problems arise or adjustments needed.    She also requested a second pair or orthotics for her dress shoes. I will ask Dr. Amalia Hailey to write Rx for dress shoes and we will call when ready for dispensing

## 2021-04-29 ENCOUNTER — Ambulatory Visit: Payer: BC Managed Care – PPO | Admitting: Physician Assistant

## 2021-06-07 ENCOUNTER — Ambulatory Visit: Payer: BC Managed Care – PPO | Admitting: Family

## 2021-07-10 ENCOUNTER — Ambulatory Visit: Payer: BC Managed Care – PPO | Admitting: Nurse Practitioner

## 2021-08-09 ENCOUNTER — Ambulatory Visit: Payer: BC Managed Care – PPO | Admitting: Nurse Practitioner

## 2021-08-25 NOTE — Progress Notes (Deleted)
? ?  New Patient Office Visit ? ?Subjective:  ?Patient ID: Nina Hill, female    DOB: 1980/07/07  Age: 41 y.o. MRN: [id number] ? ?CC: No chief complaint on file. ? ? ?HPI ?Nina Hill presents for new patient visit to establish care.  Introduced to Designer, jewellery role and practice setting.  All questions answered.  Discussed provider/patient relationship and expectations. ? ? ?Past Medical History:  ?Diagnosis Date  ? Chlamydia infection 05/2010  ? GC (gonococcus infection) 05/2010  ? Migraine   ? Pneumonia 04/2012  ? SAB (spontaneous abortion) 05/2001  ? ? ?Past Surgical History:  ?Procedure Laterality Date  ? IUD REMOVAL  06/30/2014  ? due to malposition, cramping and bleeding  ? ? ?Family History  ?Problem Relation Age of Onset  ? Hypertension Mother   ? Hypertension Father   ? Hypertension Sister   ? Migraines Sister   ? Asthma Sister   ? Asthma Brother   ? Diabetes Paternal Grandmother   ? ? ?Social History  ? ?Socioeconomic History  ? Marital status: Married  ?  Spouse name: Not on file  ? Number of children: Not on file  ? Years of education: Not on file  ? Highest education level: Not on file  ?Occupational History  ? Not on file  ?Tobacco Use  ? Smoking status: Never  ? Smokeless tobacco: Never  ?Vaping Use  ? Vaping Use: Never used  ?Substance and Sexual Activity  ? Alcohol use: Yes  ?  Comment: not often  ? Drug use: No  ? Sexual activity: Yes  ?  Birth control/protection: None  ?Other Topics Concern  ? Not on file  ?Social History Narrative  ? Not on file  ? ?Social Determinants of Health  ? ?Financial Resource Strain: Not on file  ?Food Insecurity: Not on file  ?Transportation Needs: Not on file  ?Physical Activity: Not on file  ?Stress: Not on file  ?Social Connections: Not on file  ?Intimate Partner Violence: Not on file  ? ? ?ROS ?Review of Systems ? ?Objective:  ? ?Today's Vitals: There were no vitals taken for this visit. ? ?Physical Exam ? ?Assessment & Plan:  ? ?Problem List Items Addressed  This Visit   ?None ? ? ?Outpatient Encounter Medications as of 08/26/2021  ?Medication Sig  ? Multiple Vitamin (MULTIVITAMIN) tablet Take 1 tablet by mouth daily.  ? topiramate (TOPAMAX) 100 MG tablet Take 100 mg by mouth 2 (two) times daily.  ? ?No facility-administered encounter medications on file as of 08/26/2021.  ? ? ?Follow-up: No follow-ups on file.  ? ?Charyl Dancer, NP ? ?

## 2021-08-26 ENCOUNTER — Ambulatory Visit: Payer: BC Managed Care – PPO | Admitting: Nurse Practitioner

## 2021-10-25 ENCOUNTER — Other Ambulatory Visit: Payer: Self-pay | Admitting: Obstetrics and Gynecology

## 2021-10-25 ENCOUNTER — Telehealth: Payer: Self-pay

## 2021-10-25 ENCOUNTER — Ambulatory Visit: Payer: BC Managed Care – PPO | Admitting: Physician Assistant

## 2021-10-25 DIAGNOSIS — Z1231 Encounter for screening mammogram for malignant neoplasm of breast: Secondary | ICD-10-CM

## 2021-10-25 NOTE — Telephone Encounter (Signed)
Apt for 10/25/21 930 am cx per request - vml for pt to call back    [facility] [place] Client Site [facility] [place] Type Call Who Is Calling Patient / Member / Family / Caregiver Caller Name [name] Phone Number [phone] Patient Name Nina Hill Patient DOB 1980/07/09 Call Type Message Only Information Provided Reason for Call Request to [place] Appointment Initial Comment Caller needs to cancel her appt for today at 930 AM, as she has a family emergency. Patient request to speak to RN No Disp. Time Disposition Final User 10/25/2021 7:27:48 AM General Information Provided Yes Rhea Belton Call Closed By: Rhea Belton Transaction Date/Time: 10/25/2021 7:24:57 AM (ET)

## 2021-10-30 ENCOUNTER — Other Ambulatory Visit: Payer: Self-pay | Admitting: Obstetrics and Gynecology

## 2021-10-30 DIAGNOSIS — N63 Unspecified lump in unspecified breast: Secondary | ICD-10-CM

## 2021-12-12 DIAGNOSIS — Z1231 Encounter for screening mammogram for malignant neoplasm of breast: Secondary | ICD-10-CM

## 2022-01-09 ENCOUNTER — Ambulatory Visit (HOSPITAL_BASED_OUTPATIENT_CLINIC_OR_DEPARTMENT_OTHER): Payer: BC Managed Care – PPO | Admitting: Family Medicine

## 2022-01-14 ENCOUNTER — Other Ambulatory Visit: Payer: BC Managed Care – PPO

## 2022-01-20 ENCOUNTER — Other Ambulatory Visit: Payer: Self-pay | Admitting: Podiatrist

## 2022-02-03 ENCOUNTER — Other Ambulatory Visit: Payer: BC Managed Care – PPO

## 2022-02-21 ENCOUNTER — Other Ambulatory Visit: Payer: BC Managed Care – PPO

## 2022-03-14 ENCOUNTER — Encounter: Payer: BC Managed Care – PPO | Admitting: Radiology

## 2022-03-30 IMAGING — MG MM DIGITAL DIAGNOSTIC UNILAT*L* W/ TOMO W/ CAD
6 series · 6 of 18 positions shown · non-contrast
Comparison: Previous exam(s).

CLINICAL DATA: Patient for short-term follow-up probably benign
left breast mass.

EXAM:
DIGITAL DIAGNOSTIC UNILATERAL LEFT MAMMOGRAM WITH TOMOSYNTHESIS AND
CAD; ULTRASOUND LEFT BREAST LIMITED
TECHNIQUE: Left digital diagnostic mammography and breast tomosynthesis was
performed. The images were evaluated with computer-aided detection.;
Targeted ultrasound examination of the left breast was performed

[L MLO synth-2D (1 of 2)]
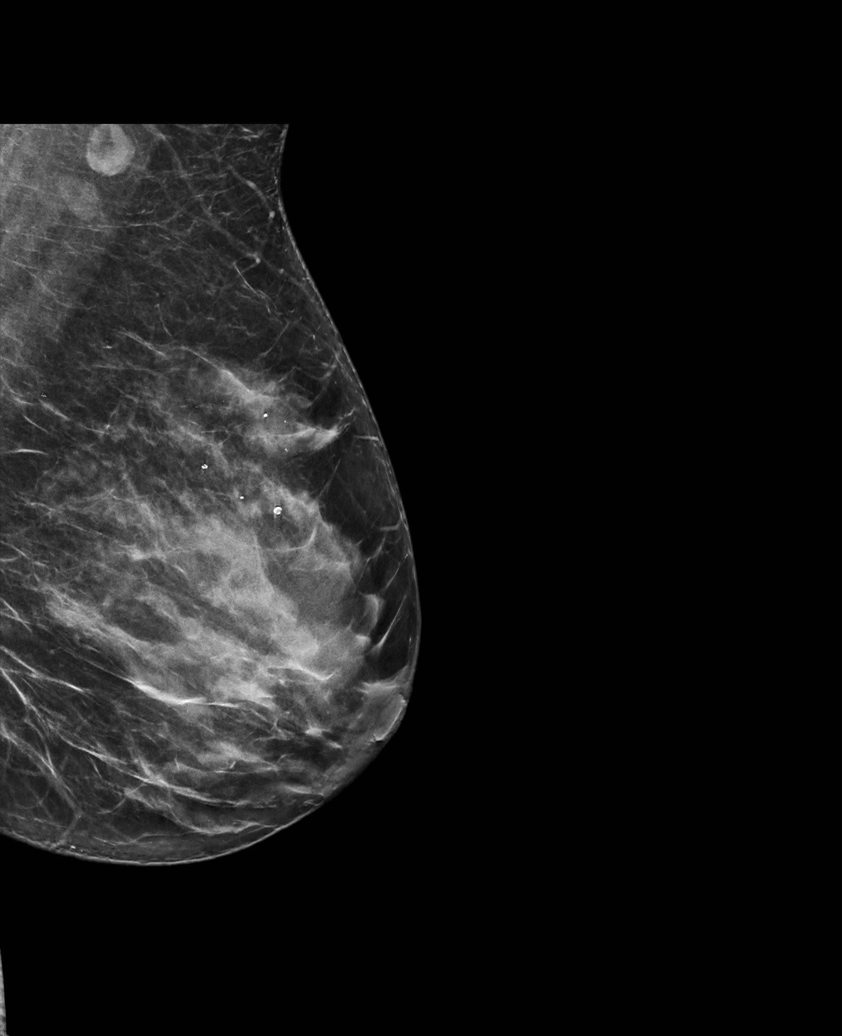

[L MLO synth-2D (2 of 2)]
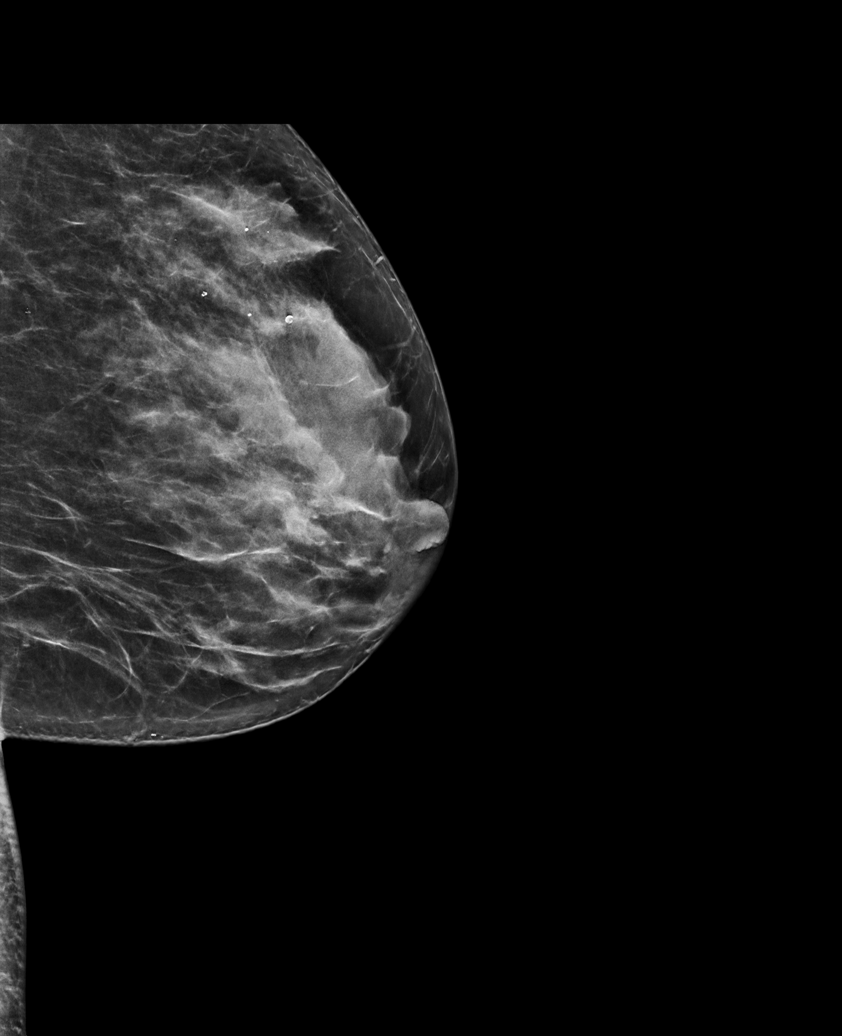

[L CC synth-2D]
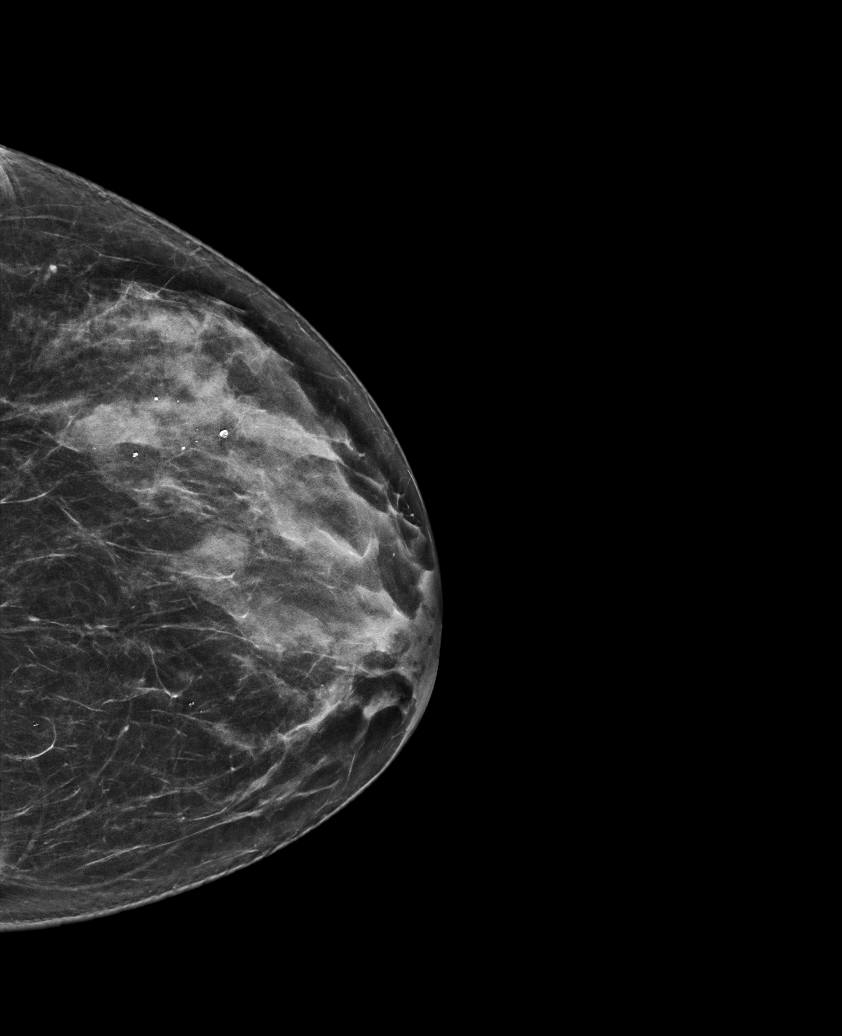

[L MLO tomo (1 of 2) · tomo slice 37/72.0]
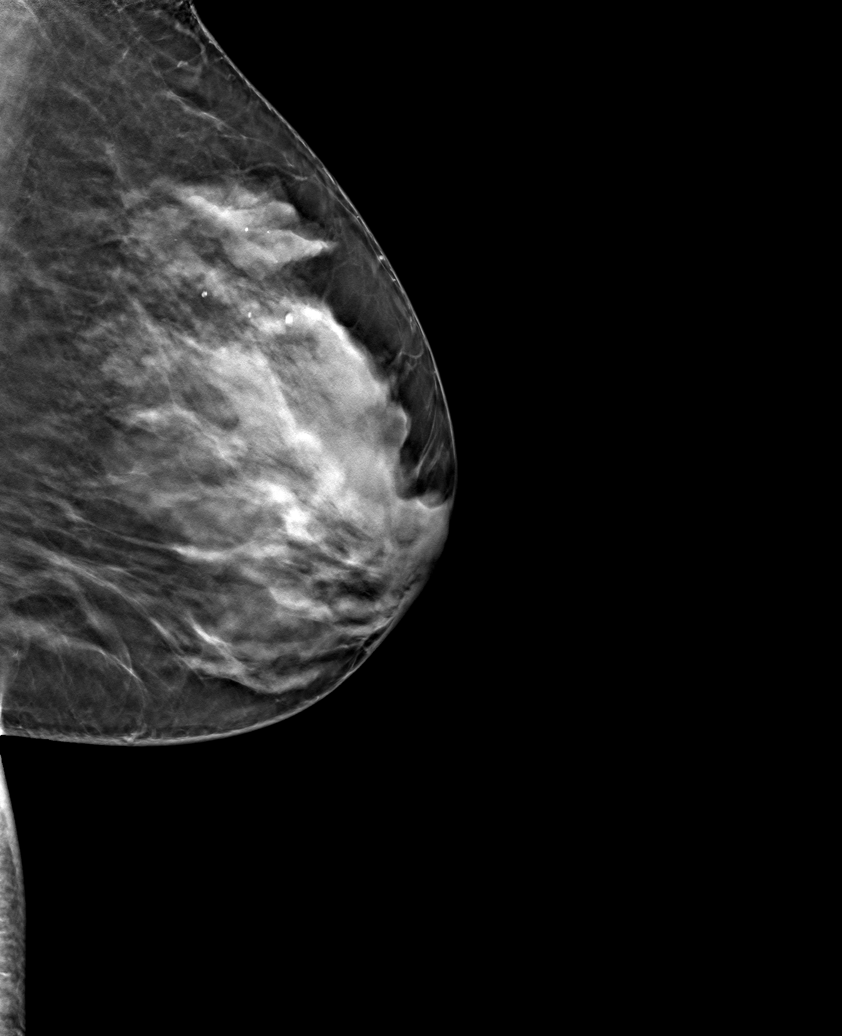

[L CC tomo · tomo slice 35/68.0]
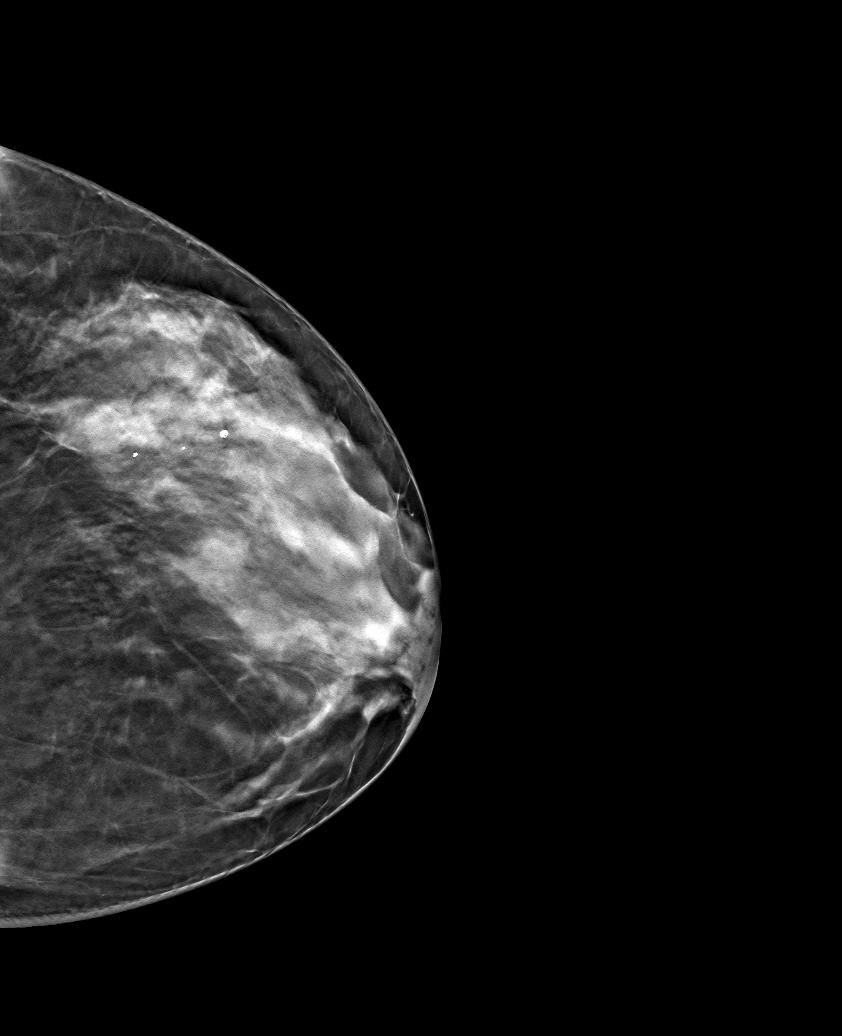

[L MLO tomo (2 of 2) · tomo slice 38/75.0]
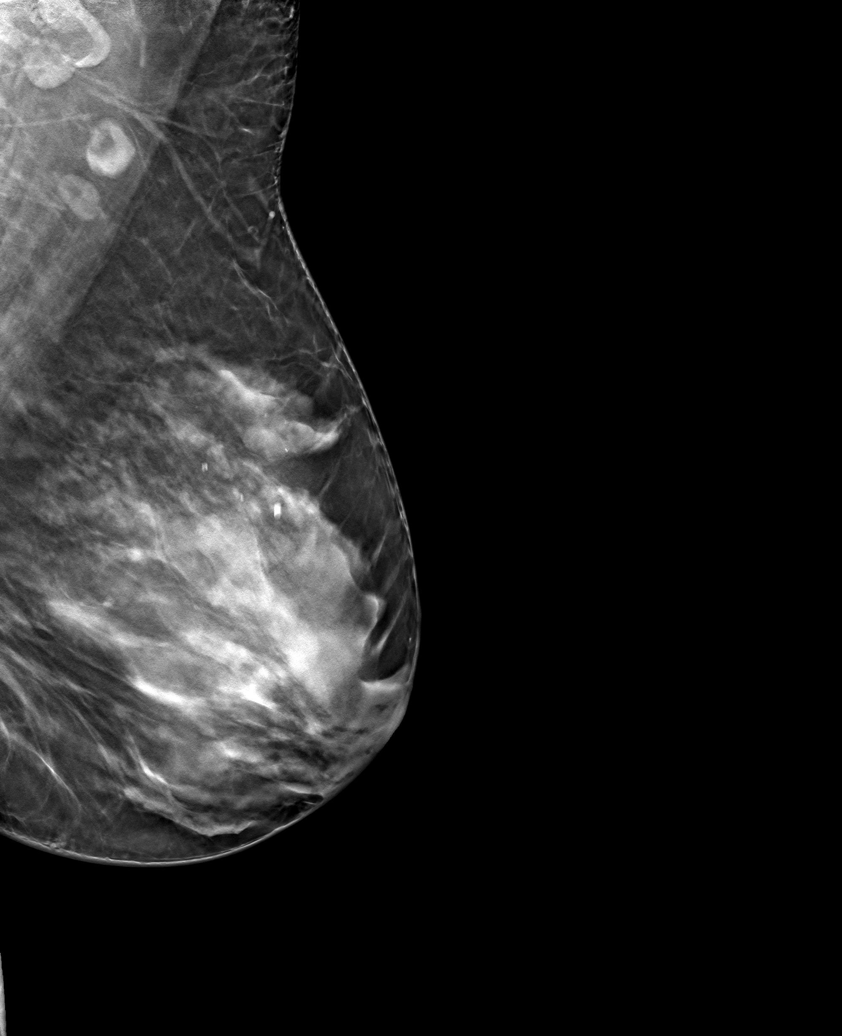

[6 of 18 positions shown; findings below may reference images not displayed]

ACR Breast Density Category c: The breast tissue is heterogeneously
dense, which may obscure small masses.
FINDINGS: Grossly unchanged partially obscured mass within the upper-outer
left breast. No additional new masses, calcifications or distortion
identified within the left breast.

On physical exam, small mobile mass palpated upper-outer left
breast.

Targeted ultrasound is performed, showing a similar area of
fibrocystic changes within the left breast 2 o'clock position 6 cm
from nipple measuring 3.3 x 1.5 x 4.3 cm.
IMPRESSION: Similar-appearing area of fibrocystic changes within the upper-outer
left breast corresponding with the palpable area of concern.

RECOMMENDATION:
Bilateral diagnostic mammography and left breast ultrasound [DATE].

I have discussed the findings and recommendations with the patient.
If applicable, a reminder letter will be sent to the patient
regarding the next appointment.

BI-RADS CATEGORY  3: Probably benign.

## 2022-04-16 ENCOUNTER — Ambulatory Visit
Admission: RE | Admit: 2022-04-16 | Discharge: 2022-04-16 | Disposition: A | Discharge status: Home / Self Care | Payer: BC Managed Care – PPO | Source: Ambulatory Visit | Attending: Obstetrics and Gynecology | Admitting: Obstetrics and Gynecology

## 2022-04-16 DIAGNOSIS — N63 Unspecified lump in unspecified breast: Secondary | ICD-10-CM

## 2022-12-03 ENCOUNTER — Encounter: Payer: BC Managed Care – PPO | Admitting: Family Medicine

## 2023-03-24 ENCOUNTER — Other Ambulatory Visit: Payer: Self-pay | Admitting: Obstetrics and Gynecology

## 2023-03-24 DIAGNOSIS — Z1231 Encounter for screening mammogram for malignant neoplasm of breast: Secondary | ICD-10-CM

## 2023-04-16 DIAGNOSIS — Z1231 Encounter for screening mammogram for malignant neoplasm of breast: Secondary | ICD-10-CM

## 2023-04-23 DIAGNOSIS — Z1231 Encounter for screening mammogram for malignant neoplasm of breast: Secondary | ICD-10-CM

## 2023-05-28 DIAGNOSIS — Z1231 Encounter for screening mammogram for malignant neoplasm of breast: Secondary | ICD-10-CM

## 2023-06-03 ENCOUNTER — Other Ambulatory Visit: Payer: Self-pay | Admitting: Obstetrics and Gynecology

## 2023-06-03 DIAGNOSIS — Z1231 Encounter for screening mammogram for malignant neoplasm of breast: Secondary | ICD-10-CM

## 2023-06-19 ENCOUNTER — Ambulatory Visit: Payer: 59

## 2023-08-24 ENCOUNTER — Other Ambulatory Visit: Payer: Self-pay | Admitting: Family Medicine

## 2023-08-24 ENCOUNTER — Ambulatory Visit
Admission: RE | Admit: 2023-08-24 | Discharge: 2023-08-24 | Disposition: A | Discharge status: Home / Self Care | Source: Ambulatory Visit | Attending: Obstetrics and Gynecology | Admitting: Obstetrics and Gynecology

## 2023-08-24 DIAGNOSIS — Z1231 Encounter for screening mammogram for malignant neoplasm of breast: Secondary | ICD-10-CM

## 2023-08-27 ENCOUNTER — Other Ambulatory Visit: Payer: Self-pay | Admitting: Obstetrics and Gynecology

## 2023-08-27 DIAGNOSIS — R928 Other abnormal and inconclusive findings on diagnostic imaging of breast: Secondary | ICD-10-CM

## 2023-10-01 ENCOUNTER — Ambulatory Visit
Admission: RE | Admit: 2023-10-01 | Discharge: 2023-10-01 | Disposition: A | Discharge status: Home / Self Care | Source: Ambulatory Visit | Attending: Obstetrics and Gynecology | Admitting: Obstetrics and Gynecology

## 2023-10-01 DIAGNOSIS — R928 Other abnormal and inconclusive findings on diagnostic imaging of breast: Secondary | ICD-10-CM

## 2023-10-02 ENCOUNTER — Other Ambulatory Visit: Payer: Self-pay | Admitting: Obstetrics and Gynecology

## 2023-10-02 DIAGNOSIS — R921 Mammographic calcification found on diagnostic imaging of breast: Secondary | ICD-10-CM

## 2024-04-04 ENCOUNTER — Encounter

## 2024-04-14 ENCOUNTER — Encounter

## 2024-06-16 ENCOUNTER — Encounter

## 2024-06-27 ENCOUNTER — Encounter
